# Patient Record
Sex: Male | Born: 1945 | Race: Black or African American | Hispanic: No | Smoking: Former smoker
Health system: Southern US, Community
[De-identification: ages and names within clinical notes are randomized; demographics above are authoritative.]

## PROBLEM LIST (undated history)

## (undated) DIAGNOSIS — I639 Cerebral infarction, unspecified: Secondary | ICD-10-CM

## (undated) DIAGNOSIS — Z951 Presence of aortocoronary bypass graft: Secondary | ICD-10-CM

## (undated) DIAGNOSIS — N182 Chronic kidney disease, stage 2 (mild): Secondary | ICD-10-CM

## (undated) DIAGNOSIS — Z95810 Presence of automatic (implantable) cardiac defibrillator: Secondary | ICD-10-CM

## (undated) DIAGNOSIS — I6529 Occlusion and stenosis of unspecified carotid artery: Secondary | ICD-10-CM

## (undated) DIAGNOSIS — I480 Paroxysmal atrial fibrillation: Secondary | ICD-10-CM

## (undated) DIAGNOSIS — F419 Anxiety disorder, unspecified: Secondary | ICD-10-CM

## (undated) DIAGNOSIS — I2510 Atherosclerotic heart disease of native coronary artery without angina pectoris: Secondary | ICD-10-CM

## (undated) DIAGNOSIS — I255 Ischemic cardiomyopathy: Secondary | ICD-10-CM

## (undated) DIAGNOSIS — E1129 Type 2 diabetes mellitus with other diabetic kidney complication: Secondary | ICD-10-CM

## (undated) DIAGNOSIS — I350 Nonrheumatic aortic (valve) stenosis: Secondary | ICD-10-CM

## (undated) DIAGNOSIS — I10 Essential (primary) hypertension: Secondary | ICD-10-CM

## (undated) HISTORY — PX: CARDIAC SURGERY: SHX584

## (undated) HISTORY — DX: Occlusion and stenosis of unspecified carotid artery: I65.29

---

## 2000-11-28 DIAGNOSIS — Z951 Presence of aortocoronary bypass graft: Secondary | ICD-10-CM

## 2000-11-28 HISTORY — DX: Presence of aortocoronary bypass graft: Z95.1

## 2012-05-28 DIAGNOSIS — Z95810 Presence of automatic (implantable) cardiac defibrillator: Secondary | ICD-10-CM

## 2012-05-28 HISTORY — DX: Presence of automatic (implantable) cardiac defibrillator: Z95.810

## 2012-05-28 HISTORY — PX: EP IMPLANTABLE DEVICE: SHX172B

## 2014-10-28 DIAGNOSIS — I639 Cerebral infarction, unspecified: Secondary | ICD-10-CM

## 2014-10-28 HISTORY — PX: CAROTID ENDARTERECTOMY: SUR193

## 2014-10-28 HISTORY — DX: Cerebral infarction, unspecified: I63.9

## 2014-12-04 ENCOUNTER — Encounter: Payer: Self-pay | Admitting: Occupational Therapy

## 2014-12-04 ENCOUNTER — Ambulatory Visit: Payer: Medicare Other

## 2014-12-04 ENCOUNTER — Ambulatory Visit: Payer: Medicare Other | Attending: Orthopedic Surgery

## 2014-12-04 ENCOUNTER — Ambulatory Visit: Payer: Medicare Other | Admitting: Occupational Therapy

## 2014-12-04 DIAGNOSIS — R269 Unspecified abnormalities of gait and mobility: Secondary | ICD-10-CM | POA: Diagnosis not present

## 2014-12-04 DIAGNOSIS — R4189 Other symptoms and signs involving cognitive functions and awareness: Secondary | ICD-10-CM | POA: Insufficient documentation

## 2014-12-04 DIAGNOSIS — H547 Unspecified visual loss: Secondary | ICD-10-CM | POA: Diagnosis not present

## 2014-12-04 DIAGNOSIS — G8191 Hemiplegia, unspecified affecting right dominant side: Secondary | ICD-10-CM

## 2014-12-04 DIAGNOSIS — I6980 Unspecified sequelae of other cerebrovascular disease: Secondary | ICD-10-CM | POA: Diagnosis not present

## 2014-12-04 DIAGNOSIS — R4701 Aphasia: Secondary | ICD-10-CM | POA: Diagnosis not present

## 2014-12-04 DIAGNOSIS — R279 Unspecified lack of coordination: Secondary | ICD-10-CM | POA: Insufficient documentation

## 2014-12-04 DIAGNOSIS — IMO0002 Reserved for concepts with insufficient information to code with codable children: Secondary | ICD-10-CM

## 2014-12-04 NOTE — Therapy (Addendum)
[facility] [address] [place], [place], [address] Phone: [phone]   Fax:  [phone]  Physical Therapy Evaluation  Patient Details  Name: Eric Mcguire MRN: [id number] Date of Birth: May 01, 1946 Referring Provider:  Dr. Madelaine Bhat  Encounter Date: 12/04/2014      PT End of Session - 12/04/14 1656    Visit Number 1   Number of Visits 17   Date for PT Re-Evaluation 02/02/15   Authorization Type G-code every 10th visit   PT Start Time 1448   PT Stop Time 1526   PT Time Calculation (min) 38 min   Equipment Utilized During Treatment Gait belt   Activity Tolerance Patient tolerated treatment well   Behavior During Therapy Impulsive  difficulty following commands.      History reviewed. No pertinent past medical history.  History reviewed. No pertinent past surgical history.  There were no vitals taken for this visit.  Visit Diagnosis:  Abnormality of gait  Lack of coordination due to stroke      Subjective Assessment - 12/04/14 1459    Symptoms Impaired balance, difficulty walking, R sided weakness, fatigue   Pertinent History CABG, CVA on 10/30/14   Patient Stated Goals more independent, "be alright, just be stronger", walk longer distances    Currently in Pain? No/denies          [facility] PT Assessment - 12/04/14 1502    Assessment   Medical Diagnosis CVA   Onset Date 10/30/14   Precautions   Precautions Fall   Restrictions   Weight Bearing Restrictions No   Balance Screen   Has the patient fallen in the past 6 months Yes   How many times? 2   Has the patient had a decrease in activity level because of a fear of falling?  Yes   Is the patient reluctant to leave their home because of a fear of falling?  Yes   Home Environment   Living Enviornment Private residence   Living Arrangements Other (Comment)  Phyllis (neice), nephew and grand nephew   Available Help at Discharge Family   Type of Home Other(Comment)  townhome   Home Access Level entry   Home Layout Two level   Alternate Level Stairs-Number of Steps 12   Alternate Level Stairs-Rails Right   Home Equipment [place] - single point;Shower seat;Walker - 2 wheels;Walker - 4 wheels   Prior Function   Level of Independence Independent with basic ADLs;Independent with homemaking with ambulation;Independent with transfers;Independent with gait   Vocation Retired   Leisure shopping, roadtrips   Cognition   Overall Cognitive Status Impaired/Different from baseline   Memory Impaired   Behaviors Impulsive   Sensation   Light Touch Impaired by gross assessment   Additional Comments Decreased R UE light touch   Coordination   Gross Motor Movements are Fluid and Coordinated No   Fine Motor Movements are Fluid and Coordinated No   Coordination and Movement Description Decreased RAM with R UE and decreased time for finger to thumb with R UE.   Posture/Postural Control   Posture/Postural Control No significant limitations   AROM   Overall AROM  Deficits   Overall AROM Comments L UE and B LE AROM WFL, R UE AROM limited and being addressed by OT.   Strength   Overall Strength Deficits   Overall Strength Comments B LE strength grossly 4+/5, except for B dorsiflexion 4/5.   Transfers   Transfers Sit to Stand;Stand to Sit  Sit to Stand 5: Supervision;With armrests;From chair/3-in-1   Stand to Sit 5: Supervision;With upper extremity assist;To chair/3-in-1   Ambulation/Gait   Ambulation/Gait Yes   Ambulation/Gait Assistance 5: Supervision;4: Min guard  min guard during turns   Ambulation/Gait Assistance Details Pt ambulated over even terrain, with no LOB but unsteadiness noted during turns. Pt also started off ambulating at slower pace and then was able to improve gait speed after first 20' of ambulation.   Ambulation Distance (Feet) 150 Feet   Assistive device None   Gait Pattern Step-through pattern;Decreased stride  length;Decreased dorsiflexion - right  decrease trunk rotation, and B UEs in IR during amb.   Gait velocity 3.5415ft/sec.   Balance   Balance Assessed Yes   Static Standing Balance   Static Standing - Balance Support No upper extremity supported   Static Standing - Level of Assistance 4: Min assist;Other (comment)  min guard   Static Standing - Comment/# of Minutes Pt able to perform feet together/apart with eyes open without LOB, but noted to experience increased postural sway during feet together with eyes close. Pt unable to maintain balance without min A during tandem or single leg stance.   Standardized Balance Assessment   Standardized Balance Assessment Timed Up and Go Test   Timed Up and Go Test   TUG Normal TUG   Normal TUG (seconds) 10.58  unsteady during turns and required min guard                            PT Short Term Goals - 12/04/14 1700    PT SHORT TERM GOAL #1   Title Pt will be independent in HEP to improve functional mobility. Target date: 01/01/15.   Status New   PT SHORT TERM GOAL #2   Title Assess BERG and write STGs and LTGs in necessary. Target date: 01/01/15.   Status New   PT SHORT TERM GOAL #3   Title Pt will ambulate 300', over even/uneven terrain and performing turns, without AD, with supervision to improve funcitonal mobility. Target date: 01/01/14.   Status New   PT SHORT TERM GOAL #4   Title Pt will ascend/descent 12 steps, with one handrail, at MOD I level to traverse stairs at home safely. Target date: 01/01/14.   Status New           PT Long Term Goals - 12/04/14 1702    PT LONG TERM GOAL #1   Title Pt will verbalize understanding of CVA signs/symptoms to decrease risk of future CVA. Target date: 01/29/15.   Status New   PT LONG TERM GOAL #2   Title Pt will ambulate 600' over even/uneven terrain, independently, to improve functional mobility. Target date: 01/29/15.   Status New   PT LONG TERM GOAL #3   Title Pt will perform 180  turns without LOB, independently to improve functional mobility. Target date: 01/29/15.   Status New   PT LONG TERM GOAL #4   Title Pt will report no falls in that last 4 weeks to improve safety during functional mobility. Target date: 01/29/15.   Status New               Plan - 12/04/14 1455    Clinical Impression Statement Pt is a pleasant 68y/o male presenting to OPPT neuro s/p CVA on 10/30/14. Pt's niece, Jamesetta Sohyllis, present during session, she provided some history as pt had difficulty answering some questions and with attention to  taks/following commands. Pt's family reported he has difficulty walking longer distances and impaired balance after the CVA. Pt was in inpatient rehab in 3 weeks where he received PT, OT, and speech. Pt has memory deficits and expressive aphasia.  Pt reported he sometimes "feels woozy". PT unable to test vision and gaze stabilization this visit, as pt was unable to attend to task at end of session.   Pt will benefit from skilled therapeutic intervention in order to improve on the following deficits Abnormal gait;Decreased endurance;Impaired sensation;Impaired UE functional use;Decreased cognition;Decreased mobility;Decreased strength;Decreased knowledge of use of DME;Decreased balance   Rehab Potential Good   PT Frequency 2x / week   PT Duration 8 weeks   PT Treatment/Interventions ADLs/Self Care Home Management;Gait training;Neuromuscular re-education;Stair training;Patient/family education;Functional mobility training;Biofeedback;Therapeutic activities;Therapeutic exercise;Manual techniques;Electrical Stimulation;DME Instruction;Balance training   PT Next Visit Plan BERG and DGI (if appropriate), assess vision (VOR, saccades, etc), initiate balance HEP   Consulted and Agree with Plan of Care Patient;Family member/caregiver   Family Member Consulted pt's niece, Julaine Hua - 12-28-2014 1706    Functional Assessment Tool Used TUG without AD: 10.58sec  with min guard required during turns due to unsteadiness; gait speed without AD:    Functional Limitation Mobility: Walking and moving around   Mobility: Walking and Moving Around Current Status [phone]) At least 20 percent but less than 40 percent impaired, limited or restricted   Mobility: Walking and Moving Around Goal Status [phone]) At least 1 percent but less than 20 percent impaired, limited or restricted       Problem List There are no active problems to display for this patient.   Miller,Jennifer L 12-28-2014, 5:12 PM  [facility] [address] [place], [place], [address] Phone: [phone]   Fax:  [phone]   Zerita Boers, PT,DPT December 28, 2014 5:13 PM Phone: [phone] Fax: [phone]   Please sign if you agree to PT plan of care:  MD signature: _______________________________________________

## 2014-12-04 NOTE — Therapy (Signed)
[facility] [address] [place], [place], [address] Phone: [phone]   Fax:  [phone]  Occupational Therapy Evaluation  Patient Details  Name: Eric Mcguire MRN: [id number] Date of Birth: 08/25/1946 Referring Provider:  No ref. provider found  Encounter Date: 12/04/2014      OT End of Session - 12/04/14 1438    Visit Number 1   Number of Visits 16   Date for OT Re-Evaluation 01/27/15   OT Start Time 1315   OT Stop Time 1401   OT Time Calculation (min) 46 min   Activity Tolerance Patient tolerated treatment well      History reviewed. No pertinent past medical history.  History reviewed. No pertinent past surgical history.  There were no vitals taken for this visit.  Visit Diagnosis:  Hemiplegia affecting right dominant side  Impaired cognition  Lack of coordination due to stroke  Visual impairment      Subjective Assessment - 12/04/14 1332    Symptoms I am here because I had a stroke   Pertinent History pt with h/o of L CVA, h/o of syncope episode, carotid stenosis, HTN, DM, CAD, cardiomyepathy, COPD, h/o depression, AICD placement, h/o of cardiac cath.   Currently in Pain? No/denies          [facility] OT Assessment - 12/04/14 1336    Assessment   Diagnosis L CVA   Onset Date 10/23/15   Prior Therapy inpatient rehab for 4 weeks   Precautions   Precautions None   Restrictions   Weight Bearing Restrictions No   Balance Screen   Has the patient fallen in the past 6 months No   Home  Environment   Family/patient expects to be discharged to: Private residence   Living Arrangements Other relatives   Available Help at Discharge --  niece, nephew and great grand nephew   Type of Home Other (Comment)  [place]   Home Layout Two level   Bathroom Accessibility Yes   How accessible --  pt states it is hard to do stairs   Additional Comments bathroom on second foor only   Prior Function   Level of Independence Independent with basic ADLs;Independent with homemaking with ambulation   Vocation Requirements retired   ADL   Eating/Feeding Modified independent   Grooming Modified independent   Upper Body Bathing Modified independent  niece stands outside door   Lower Body Bathing Modified independent   Upper Body Dressing Minimal assistance  buttons, zipping   Lower Body Dressing Minimal assistance   Toilet Tranfer Modified independent   Toileting - Clothing Manipulation Modified independent   Toileting -  Hygiene Modified Independent   Tub/Shower Transfer Modified independent  grab bar, shower seat   ADL comments pt lived with niece prior and did not engage in cooking or housekeeping.   IADL   Shopping Needs to be accompanied on any shopping trip   Light Housekeeping Needs help with all home maintenance tasks   Community Mobility Relies on family or friends for transportation   Medication Management Is not capable of dispensing or managing own medication   Financial Management Dependent   Mobility   Mobility Status Independent;Needs assist   Mobility Status Comments niece provides supervision in community due to cognition and balance   Written Expression   Dominant Hand Right   Handwriting --  Not able   Vision - History   Additional Comments pt with visual vestibular deficits and has difficulty watching tv and  reading   Vision Assessment   Eye Alignment Impaired (comment)   Tracking/Visual Pursuits Unable to hold eye position out of midline   Saccades Impaired - to be further tested in functional context   Visual Fields --  TBA   Activity Tolerance   Activity Tolerance Tolerates 10-20 min activity with muiltiple rests   Cognition   Attention Sustained   Sustained Attention Impaired   Memory Impaired   Awareness Appears intact   Problem Solving Impaired   Sensation   Light Touch Impaired by gross assessment   Hot/Cold Impaired by gross assessment    Proprioception Appears Intact   Coordination   Gross Motor Movements are Fluid and Coordinated No   Fine Motor Movements are Fluid and Coordinated No   9 Hole Peg Test (p) unable   Box and Blocks unable   Perception   Perception Not tested   Praxis   Praxis Not tested   Edema   Edema --  to be assessed further   AROM   Overall AROM  Deficits   Overall AROM Comments shoulder flexion approximately 140 degrees, abduction approximately 70 degrees, full elbow flexion and extension, 75% supination full pronation, full wrist flexion, 5 degrees wrist extension, full composite flexion, no active finger extension   PROM   Overall PROM  Within functional limits for tasks performed   Overall PROM Comments except wrist extension   Strength   Overall Strength Deficits   Overall Strength Comments to be further assessed   RUE Strength   Gross Grasp Impaired                         OT Short Term Goals - 12/04/14 1727    OT SHORT TERM GOAL #1   Title Pt and niece will be mod I with HEP - 01/01/2015   OT SHORT TERM GOAL #2   Title Pt will be mod I with fasteners with AE prn   Status New   OT SHORT TERM GOAL #3   Title Pt will be able to use RUE as stabilizer during basic ADL's   Status New   OT SHORT TERM GOAL #4   Title Pt will demonstrate functional mid level reach to assist in functional tasks   Status New   OT SHORT TERM GOAL #5   Title Pt will be able to hold objects in R hand to carry during ADL tasks   Status New           OT Long Term Goals - 12/04/14 1730    OT LONG TERM GOAL #1   Title Pt and niece will be mod I with HEP prn - 01/27/2015   Status New   OT LONG TERM GOAL #2   Title Pt will be mod I for sustained attention in quiet environment for familiar basic task for at least 15 minutes.   Status New   OT LONG TERM GOAL #3   Title Pt and niece will be able to state three strategies to use to compensate for memory   Status New   OT LONG TERM GOAL #4    Title Pt will be mod I for organization of simple functional familiar task   Status New   OT LONG TERM GOAL #5   Title Pt will use RUE as gross assist for ADL and IADL tasks at least 50% of the time.   Long Term Additional Goals   Additional Long Term Goals Yes  OT LONG TERM GOAL #6   Title Pt will be able to reach overhead to place plate on shelf bilaterally.   Status New               Plan - 12/17/14 1439    Clinical Impression Statement Pt is a 69 year old male s/p L CVA with resultant deficits as follows:  R hemiplegia (dominant side), decreased arom, decreased strength, decreased coordinaton, decreased functional use of RUE, decreaed cognition (impaired memory, attention, organization, problem solving), impaired balance, impaired visual skills (tracking, possible field cut vs R inattention, visual vestibular integration).  Deficits impact pt's ability to complete ADLs, IADLs, and leisure.  Pt was independent prior to his stroke.   Rehab Potential Good   Clinical Impairments Affecting Rehab Potential Pt can benefit from skilled OT to address the deficits and impairments listed above.   OT Frequency 2x / week   OT Duration 8 weeks   OT Treatment/Interventions Self-care/ADL training;Fluidtherapy;Therapeutic exercise;Neuromuscular education;Manual Therapy;Functional Mobility Training;DME and/or AE instruction;Visual/perceptual remediation/compensation;Cognitive remediation/compensation;Therapeutic activities;Therapeutic exercises;Splinting;Patient/family education;Balance training   Plan further assess tone, cog and visual skills   Consulted and Agree with Plan of Care Patient;Family member/caregiver   Family Member Consulted niece          G-Codes - 2014-12-17 1735    Functional Assessment Tool Used clincal observation - pt unable to participate in formal assessments   Functional Limitation Carrying, moving and handling objects   Carrying, Moving and Handling Objects Current  Status [phone]) 100 percent impaired, limited or restricted   Carrying, Moving and Handling Objects Goal Status (U0454) At least 60 percent but less than 80 percent impaired, limited or restricted      Problem List There are no active problems to display for this patient.   Norton Pastel 12/17/2014, 5:40 PM  [facility] [address] [place], [place], [address] Phone: [phone]   Fax:  [phone]    Physician: Dr. Buck Mam  Certification Start Date: 2014/12/17 Certification End Date: 23/11/2014  Physician Documentation Your signature is required to indicate approval of the treatment plan as stated above.  Please sign and either send electronically or make a copy of this report for your files and return this physician signed original.  Please mark one 1.__approve of plan   2. ___approve of plan with the followingconditions. ____________________________________________________________________________________________________________________________________________   ______________________                                                       _____________________ Physician Signature                                                                     Date    Faxed to MD for signature

## 2014-12-04 NOTE — Therapy (Signed)
[facility] [address] [place], [place], [address] Phone: [phone]   Fax:  [phone]  Speech Language Pathology Evaluation  Patient Details  Name: Eric Mcguire MRN: [id number] Date of Birth: 12-23-1945 Referring Provider:  No ref. provider found  Encounter Date: 12/04/2014      End of Session - 12/04/14 1506    Visit Number 1   Number of Visits 16   Date for SLP Re-Evaluation 01/31/15   SLP Start Time 1404   SLP Stop Time  1447   SLP Time Calculation (min) 43 min   Activity Tolerance Patient tolerated treatment well      No past medical history on file.  No past surgical history on file.  There were no vitals taken for this visit.  Visit Diagnosis: Expressive aphasia      Subjective Assessment - 12/04/14 1419    Symptoms "IF they want to cluck the plug, they cluck the plug" ("pluck the plug") "It gets sluggish and then not sluggish - depends on what roll I'm on."          SLP Evaluation [facility] - 12/04/14 1414    SLP Visit Information   Onset Date 10-30-14   Medical Diagnosis CVA   General Information   HPI Pt underwent carotid endarerectomy after syncopal episode, and suffered CVA.    Prior Functional Status   Cognitive/Linguistic Baseline Within functional limits    Lives With --  niece   Cognition   Memory --  pt had to ask niece for medical history   Auditory Comprehension   Overall Auditory Comprehension Comments Appears intact for therapy tasks today, however focused assessment will be completed during first therapy session.   Expression   Primary Mode of Expression Verbal   Verbal Expression   Overall Verbal Expression Impaired   Level of Generative/Spontaneous Verbalization Sentence;Conversation   Repetition No impairment   Naming Impairment   Responsive 76-100% accurate  85% with neologism x1/3   Confrontation 75-100% accurate  75%   Divergent 25-49% accurate   Verbal Errors  Semantic paraphasias;Not aware of errors;Inconsistent  word salad at times   Interfering Components Attention   Other Verbal Expression Comments Pt with moderate verbal expression deficit including semantic paraphasias, neologism, and occasional word salad. Simple conversation requiring short answers, at this time, is nearly functional, however niece reports pt's communication is occasionally difficult to understand. Pt shows variable error awareness, which decreases prognosis slightly.   Written Expression   Dominant Hand Right   Written Expression Exceptions to [facility]   Self Formulation Ability Letter;Word   Overall Writen Expression --  overall commensurate with spoken language               SLP Short Term Goals - 12/04/14 1508    SLP SHORT TERM GOAL #1   Title pt will perform simple naming tasks with 90% success  (01-04-15)   Time 4   Period Weeks   Status New   SLP SHORT TERM GOAL #2   Title pt will demo error awareness of naming errors in structured tasks 100% with rare min A (01-04-15)   Time 4   Period Weeks   Status New   SLP SHORT TERM GOAL #3   Title pt will generate sentence responses with appropriate language 80% with occasional min A (01-04-15)   Time 4   Period Weeks   Status New   SLP SHORT TERM GOAL #4   Title pt will participate in  simple conversation with rare min A for listener comprehension   Time 4   Period Weeks   Status New          SLP Long Term Goals - 12/04/14 1515    SLP LONG TERM GOAL #1   Title pt will participate in mod complex conversation with occasional min A (02-01-15)   Time 8   Period Weeks   Status New   SLP LONG TERM GOAL #2   Title pt will complete mod complex naming tasks with rare min A (02-01-15)   SLP LONG TERM GOAL #3   Title pt will utilize compensations for anomia with rare min a to do so   Time 8   Period Weeks   Status New          Plan - 12/04/14 1520    Clinical Impression Statement Pt presents with moderate  expressive aphasia with rare neologism, and word salad with intermittent error awareness. Expressive language deficit complicated slightly by suspected decr'd attention. Skilled ST needed to improve pt's ability to communicate and thus QOL.          G-Codes - 12/04/14 1521    Functional Assessment Tool Used noms   Functional Limitations Spoken language expressive   Spoken Language Expression Current Status [phone]([id number]) At least 60 percent but less than 80 percent impaired, limited or restricted   Spoken Language Expression Goal Status (U0454([id number]) At least 20 percent but less than 40 percent impaired, limited or restricted      Problem List There are no active problems to display for this patient.   HurstSCHINKE,CARL, SLP 12/04/2014, 3:26 PM  [facility] [facility] [address] [place], [place], [address] Phone: [phone]   Fax:  [phone]

## 2014-12-04 NOTE — Therapy (Signed)
[facility] [address] [place], [place], [address] Phone: [phone]   Fax:  [phone]  Occupational Therapy Evaluation  Patient Details  Name: Eric Mcguire MRN: [id number] Date of Birth: 06/06/1946 Referring Provider:  No ref. provider found  Encounter Date: 12/04/2014      OT End of Session - 12/04/14 1438    Visit Number 1   Number of Visits 16   Date for OT Re-Evaluation 01/27/15   OT Start Time 1315   OT Stop Time 1401   OT Time Calculation (min) 46 min   Activity Tolerance Patient tolerated treatment well      History reviewed. No pertinent past medical history.  History reviewed. No pertinent past surgical history.  There were no vitals taken for this visit.  Visit Diagnosis:  Hemiplegia affecting right dominant side  Impaired cognition  Lack of coordination due to stroke  Visual impairment      Subjective Assessment - 12/04/14 1332    Symptoms I am here because I had a stroke   Pertinent History pt with h/o of L CVA, h/o of syncope episode, carotid stenosis, HTN, DM, CAD, cardiomyepathy, COPD, h/o depression, AICD placement, h/o of cardiac cath.   Currently in Pain? No/denies          [facility] Assessment - 12/04/14 1336    Assessment   Diagnosis L CVA   Onset Date 10/23/15   Prior Therapy inpatient rehab for 4 weeks   Precautions   Precautions None   Restrictions   Weight Bearing Restrictions No   Balance Screen   Has the patient fallen in the past 6 months No   Home  Environment   Family/patient expects to be discharged to: Private residence   Living Arrangements Other relatives   Available Help at Discharge --  niece, nephew and great grand nephew   Type of Home Other (Comment)  [place]   Home Layout Two level   Bathroom Accessibility Yes   How accessible --  pt states it is hard to do stairs   Additional Comments bathroom on second foor only   Prior Function   Level of Independence Independent with basic ADLs;Independent with homemaking with ambulation   Vocation Requirements retired   ADL   Eating/Feeding Modified independent   Grooming Modified independent   Upper Body Bathing Modified independent  niece stands outside door   Lower Body Bathing Modified independent   Upper Body Dressing Minimal assistance  buttons, zipping   Lower Body Dressing Minimal assistance   Toilet Tranfer Modified independent   Toileting - Clothing Manipulation Modified independent   Toileting -  Hygiene Modified Independent   Tub/Shower Transfer Modified independent  grab bar, shower seat   ADL comments pt lived with niece prior and did not engage in cooking or housekeeping.   IADL   Shopping Needs to be accompanied on any shopping trip   Light Housekeeping Needs help with all home maintenance tasks   Community Mobility Relies on family or friends for transportation   Medication Management Is not capable of dispensing or managing own medication   Financial Management Dependent   Mobility   Mobility Status Independent;Needs assist   Mobility Status Comments niece provides supervision in community due to cognition and balance   Written Expression   Dominant Hand Right   Handwriting --  Not able   Vision - History   Additional Comments pt with visual vestibular deficits and has difficulty watching tv and  reading   Vision Assessment   Eye Alignment Impaired (comment)   Tracking/Visual Pursuits Unable to hold eye position out of midline   Saccades Impaired - to be further tested in functional context   Visual Fields --  TBA   Activity Tolerance   Activity Tolerance Tolerates 10-20 min activity with muiltiple rests   Cognition   Attention Sustained   Sustained Attention Impaired   Memory Impaired   Awareness Appears intact   Problem Solving Impaired   Sensation   Light Touch Impaired by gross assessment   Hot/Cold Impaired by gross assessment    Proprioception Appears Intact   Coordination   Gross Motor Movements are Fluid and Coordinated No   Fine Motor Movements are Fluid and Coordinated No   9 Hole Peg Test (p) unable   Box and Blocks unable   Perception   Perception Not tested   Praxis   Praxis Not tested   Edema   Edema --  to be assessed further   AROM   Overall AROM  Deficits   Overall AROM Comments shoulder flexion approximately 140 degrees, abduction approximately 70 degrees, full elbow flexion and extension, 75% supination full pronation, full wrist flexion, 5 degrees wrist extension, full composite flexion, no active finger extension   PROM   Overall PROM  Within functional limits for tasks performed   Overall PROM Comments except wrist extension   Strength   Overall Strength Deficits   Overall Strength Comments to be further assessed   RUE Strength   Gross Grasp Impaired                         OT Short Term Goals - 12/04/14 1727    OT SHORT TERM GOAL #1   Title Pt and niece will be mod I with HEP - 01/01/2015   OT SHORT TERM GOAL #2   Title Pt will be mod I with fasteners with AE prn   Status New   OT SHORT TERM GOAL #3   Title Pt will be able to use RUE as stabilizer during basic ADL's   Status New   OT SHORT TERM GOAL #4   Title Pt will demonstrate functional mid level reach to assist in functional tasks   Status New   OT SHORT TERM GOAL #5   Title Pt will be able to hold objects in R hand to carry during ADL tasks   Status New           OT Long Term Goals - 12/04/14 1730    OT LONG TERM GOAL #1   Title Pt and niece will be mod I with HEP prn - 01/27/2015   Status New   OT LONG TERM GOAL #2   Title Pt will be mod I for sustained attention in quiet environment for familiar basic task for at least 15 minutes.   Status New   OT LONG TERM GOAL #3   Title Pt and niece will be able to state three strategies to use to compensate for memory   Status New   OT LONG TERM GOAL #4    Title Pt will be mod I for organization of simple functional familiar task   Status New   OT LONG TERM GOAL #5   Title Pt will use RUE as gross assist for ADL and IADL tasks at least 50% of the time.   Long Term Additional Goals   Additional Long Term Goals Yes  OT LONG TERM GOAL #6   Title Pt will be able to reach overhead to place plate on shelf bilaterally.   Status New               Plan - 12-24-2014 1439    Clinical Impression Statement Pt is a 69 year old male s/p L CVA with resultant deficits as follows:  R hemiplegia (dominant side), decreased arom, decreased strength, decreased coordinaton, decreased functional use of RUE, decreaed cognition (impaired memory, attention, organization, problem solving), impaired balance, impaired visual skills (tracking, possible field cut vs R inattention, visual vestibular integration).  Deficits impact pt's ability to complete ADLs, IADLs, and leisure.  Pt was independent prior to his stroke.   Rehab Potential Good   Clinical Impairments Affecting Rehab Potential Pt can benefit from skilled OT to address the deficits and impairments listed above.   OT Frequency 2x / week   OT Duration 8 weeks   OT Treatment/Interventions Self-care/ADL training;Fluidtherapy;Therapeutic exercise;Neuromuscular education;Manual Therapy;Functional Mobility Training;DME and/or AE instruction;Visual/perceptual remediation/compensation;Cognitive remediation/compensation;Therapeutic activities;Therapeutic exercises;Splinting;Patient/family education;Balance training   Plan further assess tone, cog and visual skills   Consulted and Agree with Plan of Care Patient;Family member/caregiver   Family Member Consulted niece          G-Codes - December 24, 2014 1735    Functional Assessment Tool Used clincal observation - pt unable to participate in formal assessments   Functional Limitation Carrying, moving and handling objects   Carrying, Moving and Handling Objects Current  Status [phone]) 100 percent impaired, limited or restricted   Carrying, Moving and Handling Objects Goal Status (B2841) At least 60 percent but less than 80 percent impaired, limited or restricted      Problem List There are no active problems to display for this patient.   Norton Pastel, OTR/L 2014-12-24, 5:36 PM  [facility] [address] [place], [place], [address] Phone: [phone]   Fax:  [phone]

## 2014-12-08 ENCOUNTER — Telehealth: Payer: Self-pay

## 2014-12-08 ENCOUNTER — Ambulatory Visit: Payer: Medicare Other

## 2014-12-08 NOTE — Telephone Encounter (Signed)
Pt no-showed due to admission to [facility] [place] for [facility] issues. Told Phillis (niece, caregiver) we needed resume orders.

## 2014-12-10 ENCOUNTER — Ambulatory Visit: Payer: Medicare Other

## 2014-12-16 ENCOUNTER — Ambulatory Visit: Payer: Medicare Other | Admitting: Physical Therapy

## 2014-12-16 ENCOUNTER — Ambulatory Visit: Payer: Medicare Other | Admitting: Occupational Therapy

## 2014-12-16 ENCOUNTER — Ambulatory Visit: Payer: Medicare Other

## 2014-12-16 ENCOUNTER — Telehealth: Payer: Self-pay | Admitting: Occupational Therapy

## 2014-12-16 NOTE — Telephone Encounter (Signed)
Pt did not show for appointment. Attempted to call but no answer.  Left message that he had an appointment as well as 2 other appointments today.  Asked pt or family to please call the outpatient center to clarify appointments.

## 2014-12-22 ENCOUNTER — Telehealth: Payer: Self-pay | Admitting: *Deleted

## 2014-12-23 ENCOUNTER — Encounter: Payer: Medicare Other | Admitting: Occupational Therapy

## 2014-12-23 ENCOUNTER — Ambulatory Visit: Payer: Medicare Other | Admitting: Physical Therapy

## 2014-12-23 ENCOUNTER — Ambulatory Visit: Payer: Medicare Other

## 2014-12-23 ENCOUNTER — Telehealth: Payer: Self-pay

## 2014-12-23 NOTE — Therapy (Signed)
[facility] [address] [place], [place], [address] Phone: [phone]   Fax:  [phone]  Patient Details  Name: Eric Mcguire MRN: [id number] Date of Birth: January 10, 1946 Referring Provider:  No ref. provider found  Encounter Date: 12/23/2014  PHYSICAL THERAPY DISCHARGE SUMMARY  Visits from Start of Care: 1  Current functional level related to goals / functional outcomes: PT SHORT TERM GOAL #1     Title  Pt will be independent in HEP to improve functional mobility. Target date: 01/01/15.    Status  New    PT SHORT TERM GOAL #2    Title  Assess BERG and write STGs and LTGs in necessary. Target date: 01/01/15.    Status  New    PT SHORT TERM GOAL #3    Title  Pt will ambulate 300', over even/uneven terrain and performing turns, without AD, with supervision to improve funcitonal mobility. Target date: 01/01/14.    Status  New    PT SHORT TERM GOAL #4    Title  Pt will ascend/descent 12 steps, with one handrail, at MOD I level to traverse stairs at home safely. Target date: 01/01/14.    Status  New        12/04/14 1702    PT LONG TERM GOAL #1    Title  Pt will verbalize understanding of CVA signs/ symptoms to decrease risk of future CVA. Target date: 01/29/15.    Status  New    PT LONG TERM GOAL #2    Title  Pt will ambulate 600' over even/uneven terrain, independently, to improve functional mobility. Target date: 01/29/15.    Status  New    PT LONG TERM GOAL #3    Title  Pt will perform 180 turns without LOB, independently to improve functional mobility. Target date: 01/29/15.    Status  New    PT LONG TERM GOAL #4    Title  Pt will report no falls in that last 4 weeks to improve safety during functional mobility. Target date: 01/29/15.    Status  New       Remaining deficits: Unknown, as pt was only seen for a PT evaluation and was then hospitalized at the [place]. It  is unknown if pt met any goals, as he was not seen after initial evaluation. Pt is now being followed by home health PT after hospital discharge, so [facility] neuro is discharging pt.   Education / Equipment: none Plan: Patient agrees to discharge.  Patient goals were not met. Patient is being discharged due to a change in medical status.  ?????       Ansar Skoda L 12/23/2014, 12:10 PM  [facility] [address] [place], [place], [address] Phone: [phone]   Fax:  [phone]   Geoffry Paradise, PT,DPT 12/23/2014 12:10 PM Phone: [phone] Fax: [phone]

## 2014-12-23 NOTE — Therapy (Signed)
[facility] [address] [place], [place], [address] Phone: [phone]   Fax:  [phone]  Patient Details  Name: Eric Mcguire MRN: [id number] Date of Birth: January 18, 1946 Referring Provider:  No ref. provider found  Encounter Date: 12/23/2014 SPEECH THERAPY DISCHARGE SUMMARY  Visits from Start of Care: **  Current functional level related to goals / functional outcomes: Pt was admitted to [facility] after initial eval earlier this month and discharged from that facility with home health therapies, according to niece/caregiver Phillis. Will d/c pt at this time.   Remaining deficits: All deficits remain.    Education / Equipment: None provided - plan was to provide during therapy sessions.  Plan: Patient agrees to discharge.  Patient goals were not met. Patient is being discharged due to a change in medical status.  ?????       [place], SLP 12/23/2014, 11:45 AM  [facility] [address] [place], [place], [address] Phone: [phone]   Fax:  [phone]

## 2014-12-23 NOTE — Telephone Encounter (Signed)
Phillis (niece and caregiver) said pt was [facility]. Therapists at this clinic will d/c pt at this time.

## 2014-12-24 ENCOUNTER — Encounter: Payer: Self-pay | Admitting: Occupational Therapy

## 2014-12-24 NOTE — Therapy (Signed)
[facility] [address] [place], [place], [address] Phone: [phone]   Fax:  [phone]  Patient Details  Name: Eric Mcguire MRN: [id number] Date of Birth: 18-Feb-1946 Referring Provider:  No ref. provider found  Encounter Date: 12/24/2014  OCCUPATIONAL THERAPY DISCHARGE SUMMARY  Visits from Start of Care:1  Current functional level related to goals / functional outcomes: Please see original eval - pt participated in OT eval and then had medical issues and was hospitalized.  Pt was discharged home with order for home health therapies therefore will d/ from outpatient OT services at this time.   Remaining deficits: See initial eval   Education / Equipment: N/A  Plan: Patient agrees to discharge.  Patient goals were not met. Patient is being discharged due to a change in medical status.  ?????     Forde Radon [facility] 12/24/2014, 8:33 AM  [facility] [address] [place], [place], [address] Phone: [phone]   Fax:  [phone]

## 2014-12-25 ENCOUNTER — Ambulatory Visit: Payer: Medicare Other | Admitting: Occupational Therapy

## 2014-12-25 ENCOUNTER — Ambulatory Visit: Payer: Medicare Other | Admitting: Speech Pathology

## 2014-12-25 ENCOUNTER — Ambulatory Visit: Payer: Medicare Other

## 2014-12-29 ENCOUNTER — Ambulatory Visit: Payer: Medicare Other

## 2014-12-29 ENCOUNTER — Encounter: Payer: Medicare Other | Admitting: Occupational Therapy

## 2015-01-01 ENCOUNTER — Ambulatory Visit: Payer: Medicare Other

## 2015-01-01 ENCOUNTER — Encounter: Payer: Medicare Other | Admitting: Occupational Therapy

## 2015-01-06 ENCOUNTER — Encounter: Payer: Medicare Other | Admitting: Occupational Therapy

## 2015-01-06 ENCOUNTER — Ambulatory Visit: Payer: Medicare Other

## 2015-01-08 ENCOUNTER — Ambulatory Visit: Payer: Medicare Other

## 2015-01-08 ENCOUNTER — Encounter: Payer: Medicare Other | Admitting: Occupational Therapy

## 2015-01-28 ENCOUNTER — Emergency Department (HOSPITAL_COMMUNITY): Payer: Medicare Other

## 2015-01-28 ENCOUNTER — Encounter (HOSPITAL_COMMUNITY): Payer: Self-pay

## 2015-01-28 ENCOUNTER — Emergency Department (HOSPITAL_COMMUNITY)
Admission: EM | Admit: 2015-01-28 | Discharge: 2015-01-28 | Disposition: A | Discharge status: Home / Self Care | Payer: Medicare Other | Attending: Emergency Medicine | Admitting: Emergency Medicine

## 2015-01-28 DIAGNOSIS — Z79899 Other long term (current) drug therapy: Secondary | ICD-10-CM | POA: Insufficient documentation

## 2015-01-28 DIAGNOSIS — Z951 Presence of aortocoronary bypass graft: Secondary | ICD-10-CM | POA: Diagnosis not present

## 2015-01-28 DIAGNOSIS — I10 Essential (primary) hypertension: Secondary | ICD-10-CM | POA: Diagnosis not present

## 2015-01-28 DIAGNOSIS — Z950 Presence of cardiac pacemaker: Secondary | ICD-10-CM | POA: Diagnosis not present

## 2015-01-28 DIAGNOSIS — I2510 Atherosclerotic heart disease of native coronary artery without angina pectoris: Secondary | ICD-10-CM | POA: Diagnosis not present

## 2015-01-28 DIAGNOSIS — Z87891 Personal history of nicotine dependence: Secondary | ICD-10-CM | POA: Diagnosis not present

## 2015-01-28 DIAGNOSIS — R2231 Localized swelling, mass and lump, right upper limb: Secondary | ICD-10-CM | POA: Insufficient documentation

## 2015-01-28 DIAGNOSIS — Z794 Long term (current) use of insulin: Secondary | ICD-10-CM | POA: Diagnosis not present

## 2015-01-28 DIAGNOSIS — M7989 Other specified soft tissue disorders: Secondary | ICD-10-CM

## 2015-01-28 DIAGNOSIS — E119 Type 2 diabetes mellitus without complications: Secondary | ICD-10-CM | POA: Insufficient documentation

## 2015-01-28 DIAGNOSIS — Z9889 Other specified postprocedural states: Secondary | ICD-10-CM | POA: Diagnosis not present

## 2015-01-28 DIAGNOSIS — Z7982 Long term (current) use of aspirin: Secondary | ICD-10-CM | POA: Insufficient documentation

## 2015-01-28 DIAGNOSIS — M79641 Pain in right hand: Secondary | ICD-10-CM | POA: Diagnosis present

## 2015-01-28 DIAGNOSIS — Z8673 Personal history of transient ischemic attack (TIA), and cerebral infarction without residual deficits: Secondary | ICD-10-CM | POA: Diagnosis not present

## 2015-01-28 HISTORY — DX: Presence of aortocoronary bypass graft: Z95.1

## 2015-01-28 HISTORY — DX: Atherosclerotic heart disease of native coronary artery without angina pectoris: I25.10

## 2015-01-28 HISTORY — DX: Cerebral infarction, unspecified: I63.9

## 2015-01-28 HISTORY — DX: Essential (primary) hypertension: I10

## 2015-01-28 MED ORDER — ACETAMINOPHEN ER 650 MG PO TBCR: 650.0000 mg | EXTENDED_RELEASE_TABLET | Freq: Three times a day (TID) | ORAL | Status: AC | PRN

## 2015-01-28 MED ORDER — CEPHALEXIN 500 MG PO CAPS: 500.0000 mg | ORAL_CAPSULE | Freq: Four times a day (QID) | ORAL | Status: DC

## 2015-01-28 NOTE — ED Notes (Addendum)
Pt states that he's been working his hands out with weights in therapy because of his stroke and he might have hit it somehow.

## 2015-01-28 NOTE — ED Provider Notes (Signed)
CSN: [id number]     Arrival date & time 01/28/15  2016 History  This chart was scribed for non-physician practitioner, Emilia BeckKaitlyn Kanita Delage, PA-C working with Richardean Canalavid H Yao, MD by Gwenyth Oberatherine Macek, ED scribe. This patient was seen in room WTR6/WTR6 and the patient's care was started at 9:33 PM   Chief Complaint  Patient presents with  . Hand Pain   The history is provided by the patient. No language interpreter was used.    HPI Comments: Eric Mcguire is a 69 y.o. male brought in by his daughter who presents to the Emergency Department complaining of constant, moderate right hand swelling that started yesterday. He denies recent injuries, but reports exercising with 1-2 lb weights in the affected hand. Pt has history of CVA that left him with right-sided weakness. He denies associated pain.  Pt's daughter notes increased memory loss and confusion in the last few weeks. She states a red and glassy appearance to his eyes that started a few days ago as an associated symptom. Pt also has baseline back pain that is unchanged today.  [facility]  Past Medical History  Diagnosis Date  . Stroke   . Hypertension   . Diabetes mellitus without complication   . Coronary artery disease   . Pacemaker   . S/P CABG x 3   . Atrial fibrillation    Past Surgical History  Procedure Laterality Date  . Cardiac surgery     History reviewed. No pertinent family history. History  Substance Use Topics  . Smoking status: Former Smoker    Quit date: 12/04/1994  . Smokeless tobacco: Not on file  . Alcohol Use: No    Review of Systems  Musculoskeletal: Positive for joint swelling and arthralgias.  Psychiatric/Behavioral: Positive for confusion.  All other systems reviewed and are negative.     Allergies  Review of patient's allergies indicates not on file.  Home Medications   Prior to Admission medications   Medication Sig Start Date End Date Taking? Authorizing Provider  amiodarone (PACERONE)  200 MG tablet Take 200 mg by mouth daily.    Historical Provider, MD  amLODipine (NORVASC) 10 MG tablet Take by mouth daily.    Historical Provider, MD  aspirin 81 MG tablet Take 81 mg by mouth daily.    Historical Provider, MD  atorvastatin (LIPITOR) 40 MG tablet Take 40 mg by mouth daily.    Historical Provider, MD  busPIRone (BUSPAR) 10 MG tablet Take 10 mg by mouth 3 (three) times daily.    Historical Provider, MD  carvedilol (COREG) 6.25 MG tablet Take 6.25 mg by mouth 2 (two) times daily with a meal.    Historical Provider, MD  docusate sodium (COLACE) 100 MG capsule Take 100 mg by mouth 2 (two) times daily.    Historical Provider, MD  hydrALAZINE (APRESOLINE) 10 MG tablet Take 10 mg by mouth 3 (three) times daily.    Historical Provider, MD  insulin aspart (NOVOLOG) 100 UNIT/ML injection Inject 8 Units into the skin 3 (three) times daily before meals.    Historical Provider, MD  insulin detemir (LEVEMIR) 100 UNIT/ML injection Inject 12 Units into the skin at bedtime.    Historical Provider, MD  insulin lispro (HUMALOG) 100 UNIT/ML injection Inject into the skin 3 (three) times daily before meals.    Historical Provider, MD  lisinopril (PRINIVIL,ZESTRIL) 40 MG tablet Take 40 mg by mouth daily.    Historical Provider, MD  pantoprazole (PROTONIX) 40 MG tablet Take 40  mg by mouth daily.    Historical Provider, MD   BP 172/66 mmHg  Pulse 74  Temp(Src) 97.5 F (36.4 C) (Oral)  Resp 20  SpO2 97% Physical Exam  Constitutional: He appears well-developed and well-nourished. No distress.  HENT:  Head: Normocephalic and atraumatic.  Eyes: Conjunctivae and EOM are normal.  Neck: Neck supple. No tracheal deviation present.  Cardiovascular: Normal rate and intact distal pulses.   Radial pulse intact of right wrist  Pulmonary/Chest: Effort normal. No respiratory distress.  Abdominal: Soft. He exhibits no distension. There is no tenderness. There is no rebound.  Musculoskeletal: He exhibits  edema. He exhibits no tenderness.  Generalized edema to right hand; no obvious deformity; full ROM of right wrist and fingers of right hand  Neurological: He is alert. Coordination normal.  Sensation intact of right hand.  Skin: Skin is warm and dry.  Psychiatric: He has a normal mood and affect. His behavior is normal.  Nursing note and vitals reviewed.   ED Course  Procedures (including critical care time) DIAGNOSTIC STUDIES: Oxygen Saturation is 97% on RA, normal by my interpretation.    COORDINATION OF CARE: 9:43 PM Discussed treatment plan with pt and his daughter at bedside. They agreed to plan.  Labs Review Labs Reviewed - No data to display  Imaging Review Dg Hand Complete Right  01/28/2015   CLINICAL DATA:  RIGHT hand swelling, no known injury, decreased feeling in hand, history stroke, hypertension, diabetes, coronary artery disease post CABG  EXAM: RIGHT HAND - COMPLETE 3+ VIEW  COMPARISON:  None  FINDINGS: Diffuse soft tissue swelling.  Osseous mineralization normal.  Joint spaces preserved.  No fracture, dislocation or bone destruction.  IMPRESSION: Soft tissue swelling without acute osseous abnormalities.   Electronically Signed   By: Ulyses Southward M.D.   On: 01/28/2015 21:53     EKG Interpretation None      MDM   Final diagnoses:  Swelling of right hand    10:23 PM Patient's xray shows soft tissue swelling without other acute changes. Patient has no signs of infection such as warmth, redness, or tenderness. Patient reports injury but patient's daughter denies this. Patient will have tylenol for any pain and keflex for infection prophylaxis. Vitals stable and patient afebrile. No neurovascular compromise. Patient will return with worsening or concerning symptoms.   I personally performed the services described in this documentation, which was scribed in my presence. The recorded information has been reviewed and is accurate.     Emilia Beck, PA-C 01/28/15  2347  Richardean Canal, MD 01/29/15 [phone]

## 2015-01-28 NOTE — ED Notes (Signed)
Pt complains of his right hand being swollen, unknown injury, weaker on the right side because of a CVA, the hand is not sore when touched

## 2015-01-28 NOTE — Discharge Instructions (Signed)
Take tylenol as needed for pain. Take keflex as directed until gone. Refer to attached documents for more information. You will be contacted about your ultrasound study to rule out blood clot. Return to the ED with worsening or concerning symptoms.

## 2015-01-29 ENCOUNTER — Ambulatory Visit (HOSPITAL_COMMUNITY): Payer: Medicare Other | Attending: Emergency Medicine

## 2015-05-17 ENCOUNTER — Emergency Department (HOSPITAL_COMMUNITY): Payer: Non-veteran care

## 2015-05-17 ENCOUNTER — Emergency Department (HOSPITAL_COMMUNITY)
Admission: EM | Admit: 2015-05-17 | Discharge: 2015-05-17 | Disposition: A | Discharge status: Home / Self Care | Payer: Non-veteran care | Attending: Emergency Medicine | Admitting: Emergency Medicine

## 2015-05-17 ENCOUNTER — Encounter (HOSPITAL_COMMUNITY): Payer: Self-pay | Admitting: Emergency Medicine

## 2015-05-17 DIAGNOSIS — Z951 Presence of aortocoronary bypass graft: Secondary | ICD-10-CM | POA: Insufficient documentation

## 2015-05-17 DIAGNOSIS — Z87891 Personal history of nicotine dependence: Secondary | ICD-10-CM | POA: Diagnosis not present

## 2015-05-17 DIAGNOSIS — Z7982 Long term (current) use of aspirin: Secondary | ICD-10-CM | POA: Diagnosis not present

## 2015-05-17 DIAGNOSIS — R739 Hyperglycemia, unspecified: Secondary | ICD-10-CM

## 2015-05-17 DIAGNOSIS — Z794 Long term (current) use of insulin: Secondary | ICD-10-CM | POA: Insufficient documentation

## 2015-05-17 DIAGNOSIS — Z8673 Personal history of transient ischemic attack (TIA), and cerebral infarction without residual deficits: Secondary | ICD-10-CM | POA: Diagnosis not present

## 2015-05-17 DIAGNOSIS — R5383 Other fatigue: Secondary | ICD-10-CM | POA: Diagnosis not present

## 2015-05-17 DIAGNOSIS — Z79899 Other long term (current) drug therapy: Secondary | ICD-10-CM | POA: Diagnosis not present

## 2015-05-17 DIAGNOSIS — I4891 Unspecified atrial fibrillation: Secondary | ICD-10-CM | POA: Diagnosis not present

## 2015-05-17 DIAGNOSIS — Z950 Presence of cardiac pacemaker: Secondary | ICD-10-CM | POA: Insufficient documentation

## 2015-05-17 DIAGNOSIS — E1165 Type 2 diabetes mellitus with hyperglycemia: Secondary | ICD-10-CM | POA: Insufficient documentation

## 2015-05-17 DIAGNOSIS — I2510 Atherosclerotic heart disease of native coronary artery without angina pectoris: Secondary | ICD-10-CM | POA: Diagnosis not present

## 2015-05-17 DIAGNOSIS — Z9889 Other specified postprocedural states: Secondary | ICD-10-CM | POA: Diagnosis not present

## 2015-05-17 DIAGNOSIS — I10 Essential (primary) hypertension: Secondary | ICD-10-CM | POA: Insufficient documentation

## 2015-05-17 LAB — BASIC METABOLIC PANEL
ANION GAP: 7 (ref 5–15)
BUN: 28 mg/dL — ABNORMAL HIGH (ref 6–20)
CALCIUM: 8.8 mg/dL — AB (ref 8.9–10.3)
CO2: 23 mmol/L (ref 22–32)
Chloride: 105 mmol/L (ref 101–111)
Creatinine, Ser: 1.04 mg/dL (ref 0.61–1.24)
GFR calc Af Amer: >60 mL/min (ref >60)
GLUCOSE: 276 mg/dL — AB (ref 65–99)
Potassium: 4.4 mmol/L (ref 3.5–5.1)
Sodium: 135 mmol/L (ref 135–145)

## 2015-05-17 LAB — CBC WITH DIFFERENTIAL/PLATELET
BASOS ABS: 0 10*3/uL (ref 0.0–0.1)
Basophils Relative: 0 % (ref 0–1)
Eosinophils Absolute: 0.1 10*3/uL (ref 0.0–0.7)
Eosinophils Relative: 2 % (ref 0–5)
HCT: 36.4 % — ABNORMAL LOW (ref 39.0–52.0)
Hemoglobin: 11.7 g/dL — ABNORMAL LOW (ref 13.0–17.0)
LYMPHS PCT: 22 % (ref 12–46)
Lymphs Abs: 1.4 10*3/uL (ref 0.7–4.0)
MCH: 31 pg (ref 26.0–34.0)
MCHC: 32.1 g/dL (ref 30.0–36.0)
MCV: 96.3 fL (ref 78.0–100.0)
MONOS PCT: 16 % — AB (ref 3–12)
Monocytes Absolute: 1 10*3/uL (ref 0.1–1.0)
NEUTROS PCT: 60 % (ref 43–77)
Neutro Abs: 3.8 10*3/uL (ref 1.7–7.7)
Platelets: 193 10*3/uL (ref 150–400)
RBC: 3.78 MIL/uL — AB (ref 4.22–5.81)
RDW: 13 % (ref 11.5–15.5)
WBC: 6.4 10*3/uL (ref 4.0–10.5)

## 2015-05-17 LAB — I-STAT TROPONIN, ED: Troponin i, poc: 0.06 ng/mL (ref 0.00–0.08)

## 2015-05-17 LAB — URINALYSIS, ROUTINE W REFLEX MICROSCOPIC
Bilirubin Urine: NEGATIVE
Glucose, UA: 500 mg/dL — AB
Hgb urine dipstick: NEGATIVE
KETONES UR: NEGATIVE mg/dL
LEUKOCYTES UA: NEGATIVE
NITRITE: NEGATIVE
Protein, ur: NEGATIVE mg/dL
Specific Gravity, Urine: 1.025 (ref 1.005–1.030)
UROBILINOGEN UA: 1 mg/dL (ref 0.0–1.0)
pH: 5 (ref 5.0–8.0)

## 2015-05-17 LAB — CBG MONITORING, ED: GLUCOSE-CAPILLARY: 238 mg/dL — AB (ref 65–99)

## 2015-05-17 MED ORDER — FERROUS SULFATE 325 (65 FE) MG PO TABS: 325.0000 mg | ORAL_TABLET | Freq: Three times a day (TID) | ORAL | Status: AC

## 2015-05-17 NOTE — ED Provider Notes (Signed)
CSN: [id number]     Arrival date & time 05/17/15  1125 History   First MD Initiated Contact with Patient 05/17/15 1232     Chief Complaint  Patient presents with  . Hyperglycemia  . Fatigue     (Consider location/radiation/quality/duration/timing/severity/associated sxs/prior Treatment) Patient is a 69 y.o. male presenting with hyperglycemia. The history is provided by the patient and medical records.  Hyperglycemia Associated symptoms: fatigue     This is a 69 year old male with past medical history significant for hypertension, diabetes, coronary artery disease, A. fib not on anticoagulation, presenting to the ED for fatigue per niece-- however patient has no current physical complaints. Patient's niece is at bedside who provides most of history as she helps take care of him on a daily basis. He was hospitalized at the [place] 2 months ago for fatigue. He was found to have low hemoglobin around 6 and was transfused blood. They suspected he had a GI bleed, however his upper endoscopy and colonoscopy were negative for bleed. She states they adjusted his medications and added iron supplements. She states over the past week he has had decreased energy from his baseline. She states usually he will leave the house at least once or twice a day, however has not left the house in 1 week. Patient states "I just don't feel like going anywhere". There is been no real changes in behavior or cognition. Niece reports he does have some confusion regarding date and time, however this is been progressive and not sudden onset. She also has concern about his blood sugar as usually is around 90-120 range and over the past 2 days has been 250+.  Patient does admit to drinking water more than normal, but states this is because he was told to do so. He denies any nausea, vomiting, abdominal pain. No chest pain or shortness of breath. He denies any dysuria. Niece does report he has not had his iron supplements in the past 5 days  as it did not come in the mail as it should. She was not sure what to replace this with so she did not give him anything. He has continued taking all other medications as directed.  No recent chest pain, SOB, palpitations, dizziness, or episodes of syncope.  No noted bloody stools.  VSS.  Past Medical History  Diagnosis Date  . Stroke   . Hypertension   . Diabetes mellitus without complication   . Coronary artery disease   . Pacemaker   . S/P CABG x 3   . Atrial fibrillation    Past Surgical History  Procedure Laterality Date  . Cardiac surgery     No family history on file. History  Substance Use Topics  . Smoking status: Former Smoker    Quit date: 12/04/1994  . Smokeless tobacco: Not on file  . Alcohol Use: No    Review of Systems  Constitutional: Positive for fatigue.  All other systems reviewed and are negative.     Allergies  Review of patient's allergies indicates no known allergies.  Home Medications   Prior to Admission medications   Medication Sig Start Date End Date Taking? Authorizing Provider  amLODipine (NORVASC) 10 MG tablet Take 10 mg by mouth daily.    Yes Historical Provider, MD  aspirin 81 MG tablet Take 81 mg by mouth daily.   Yes Historical Provider, MD  atorvastatin (LIPITOR) 80 MG tablet Take 80 mg by mouth daily.   Yes Historical Provider, MD  carvedilol (COREG) 3.125  MG tablet Take 3.125 mg by mouth 2 (two) times daily with a meal.   Yes Historical Provider, MD  cholecalciferol (VITAMIN D) 1000 UNITS tablet Take 1,000 Units by mouth daily.   Yes Historical Provider, MD  clopidogrel (PLAVIX) 75 MG tablet Take 75 mg by mouth daily.   Yes Historical Provider, MD  ferrous sulfate 325 (65 FE) MG tablet Take 325 mg by mouth 3 (three) times daily with meals.   Yes Historical Provider, MD  ibuprofen (ADVIL,MOTRIN) 200 MG tablet Take 400-600 mg by mouth every 6 (six) hours as needed for headache or moderate pain.   Yes Historical Provider, MD  insulin  NPH-regular Human (NOVOLIN 70/30) (70-30) 100 UNIT/ML injection Inject 50-55 Units into the skin 2 (two) times daily. 55 units in the am and 50 units at night.   Yes Historical Provider, MD  lisinopril (PRINIVIL,ZESTRIL) 40 MG tablet Take 40 mg by mouth daily.   Yes Historical Provider, MD  magnesium oxide (MAG-OX) 400 MG tablet Take 400 mg by mouth daily.   Yes Historical Provider, MD  pantoprazole (PROTONIX) 40 MG tablet Take 40 mg by mouth daily.   Yes Historical Provider, MD  acetaminophen (TYLENOL 8 HOUR) 650 MG CR tablet Take 1 tablet (650 mg total) by mouth every 8 (eight) hours as needed for pain. Patient not taking: Reported on 05/17/2015 01/28/15   Emilia Beck, PA-C  cephALEXin (KEFLEX) 500 MG capsule Take 1 capsule (500 mg total) by mouth 4 (four) times daily. Patient not taking: Reported on 05/17/2015 01/28/15   Kaitlyn Szekalski, PA-C   BP 131/48 mmHg  Pulse 90  Temp(Src) 98.2 F (36.8 C) (Oral)  Resp 16  SpO2 95%   Physical Exam  Constitutional: He is oriented to person, place, and time. He appears well-developed and well-nourished. No distress.  Elderly, appears well  HENT:  Head: Normocephalic and atraumatic.  Mouth/Throat: Oropharynx is clear and moist.  Eyes: Conjunctivae and EOM are normal. Pupils are equal, round, and reactive to light.  Neck: Normal range of motion. Neck supple.  Cardiovascular: Normal rate, regular rhythm and normal heart sounds.   Pulmonary/Chest: Effort normal and breath sounds normal. No respiratory distress. He has no wheezes.  Abdominal: Soft. Bowel sounds are normal. There is no tenderness. There is no guarding.  Musculoskeletal: Normal range of motion. He exhibits no edema.  Neurological: He is alert and oriented to person, place, and time.  AAOx3, answering questions appropriately; equal strength UE and LE bilaterally; CN grossly intact; moves all extremities appropriately without ataxia; no focal neuro deficits or facial asymmetry  appreciated  Skin: Skin is warm and dry. He is not diaphoretic.  Psychiatric: He has a normal mood and affect.  Nursing note and vitals reviewed.   ED Course  Procedures (including critical care time) Labs Review Labs Reviewed  CBC WITH DIFFERENTIAL/PLATELET - Abnormal; Notable for the following:    RBC 3.78 (*)    Hemoglobin 11.7 (*)    HCT 36.4 (*)    Monocytes Relative 16 (*)    All other components within normal limits  BASIC METABOLIC PANEL - Abnormal; Notable for the following:    Glucose, Bld 276 (*)    BUN 28 (*)    Calcium 8.8 (*)    All other components within normal limits  URINALYSIS, ROUTINE W REFLEX MICROSCOPIC (NOT AT [facility]) - Abnormal; Notable for the following:    Color, Urine AMBER (*)    Glucose, UA 500 (*)    All other  components within normal limits  CBG MONITORING, ED - Abnormal; Notable for the following:    Glucose-Capillary 238 (*)    All other components within normal limits  I-STAT TROPOININ, ED    Imaging Review Dg Chest 2 View  05/17/2015   CLINICAL DATA:  Hypotension  EXAM: CHEST  2 VIEW  COMPARISON:  None.  FINDINGS: Borderline cardiomegaly. Normal vascularity. Left subclavian AICD device is in place with its leads in the right atrium and right ventricle. Lungs are clear. No pneumothorax.  IMPRESSION: No active cardiopulmonary disease.   Electronically Signed   By: Jolaine Click M.D.   On: 05/17/2015 13:26     EKG Interpretation   Date/Time:  Sunday May 17 2015 13:38:46 EDT Ventricular Rate:  84 PR Interval:  177 QRS Duration: 122 QT Interval:  387 QTC Calculation: 457 R Axis:   93 Text Interpretation:  Sinus rhythm LAE, consider biatrial enlargement Left  bundle branch block Confirmed by ZAMMIT  MD, JOSEPH [phone]) on 05/17/2015  1:48:57 PM      MDM   Final diagnoses:  Fatigue  Hyperglycemia   69 year old male here for generalized fatigue per niece, however patient without formal complaints. Patient's niece states over the past  week he has had decreased energy compared to his baseline. He has not left the house in one week which is atypical for him. Patient states "I just feel like going anywhere". There've been no changes in his mental status. No fevers or chills.  Patient afebrile, nontoxic. CBG here 238.  BP is stable. Patient was hospitalized 2 months ago for anemia requiring blood transfusion-- denies recent bloody stools but has been off his Fe+ for a few days.  Will check EKG, basic labs, CXR, urine.  EKG NSR without acute ischemic changes.  Lab work is overall reassuring-- glucose 276 without evidence of DKA, bicarb and anion gap remain WNL.  H/H stable.  CXR is clear.  U/a non-infectious.  Patient continues to deny any complaints.  Will re-start iron supplementation [facility]ID per [facility] orders.  Encouraged to continue to monitor glucose and BP at home.  FU with PCP.  Discussed plan with patient, he/she acknowledged understanding and agreed with plan of care.  Return precautions given for new or worsening symptoms.  Case discussed with attending physician, Dr. Estell Harpin, who agrees with assessment and plan of care.  Garlon Hatchet, PA-C 05/17/15 1522  Bethann Berkshire, MD 05/19/15 [phone]

## 2015-05-17 NOTE — ED Notes (Signed)
Pt's family member states that pt has relatively well regulated blood sugars.  This morning CBG was 268.  Family member is concerned because his BP is normally 113 systolic but today it was 110/52.  Pt is A&Ox 4.

## 2015-05-17 NOTE — Discharge Instructions (Signed)
Resume taking your iron supplements as directed-- i have re-written prescription for you. Keep close check of your blood sugar and blood pressure. Follow-up with your primary care physician. Return to the ED for new or worsening symptoms.

## 2015-05-17 NOTE — ED Notes (Signed)
Pt's niece notified pt is read for discharge, family member who answered the  up phone sts she is on her way here.

## 2015-06-29 DIAGNOSIS — I255 Ischemic cardiomyopathy: Secondary | ICD-10-CM

## 2015-06-29 HISTORY — DX: Ischemic cardiomyopathy: I25.5

## 2015-07-07 ENCOUNTER — Observation Stay (HOSPITAL_COMMUNITY): Payer: Non-veteran care

## 2015-07-07 ENCOUNTER — Emergency Department (HOSPITAL_COMMUNITY): Payer: Non-veteran care

## 2015-07-07 ENCOUNTER — Encounter (HOSPITAL_COMMUNITY): Payer: Self-pay | Admitting: Emergency Medicine

## 2015-07-07 ENCOUNTER — Inpatient Hospital Stay (HOSPITAL_COMMUNITY)
Admission: EM | Admit: 2015-07-07 | Discharge: 2015-07-13 | DRG: 287 | Disposition: A | Discharge status: Home / Self Care | Payer: Non-veteran care | Attending: Internal Medicine | Admitting: Internal Medicine

## 2015-07-07 DIAGNOSIS — I5189 Other ill-defined heart diseases: Secondary | ICD-10-CM | POA: Diagnosis present

## 2015-07-07 DIAGNOSIS — I5021 Acute systolic (congestive) heart failure: Secondary | ICD-10-CM | POA: Diagnosis not present

## 2015-07-07 DIAGNOSIS — E0590 Thyrotoxicosis, unspecified without thyrotoxic crisis or storm: Secondary | ICD-10-CM | POA: Diagnosis present

## 2015-07-07 DIAGNOSIS — I5043 Acute on chronic combined systolic (congestive) and diastolic (congestive) heart failure: Principal | ICD-10-CM | POA: Diagnosis present

## 2015-07-07 DIAGNOSIS — Z87891 Personal history of nicotine dependence: Secondary | ICD-10-CM | POA: Diagnosis not present

## 2015-07-07 DIAGNOSIS — K5900 Constipation, unspecified: Secondary | ICD-10-CM | POA: Diagnosis present

## 2015-07-07 DIAGNOSIS — R7989 Other specified abnormal findings of blood chemistry: Secondary | ICD-10-CM | POA: Diagnosis present

## 2015-07-07 DIAGNOSIS — I472 Ventricular tachycardia: Secondary | ICD-10-CM | POA: Diagnosis present

## 2015-07-07 DIAGNOSIS — I350 Nonrheumatic aortic (valve) stenosis: Secondary | ICD-10-CM | POA: Diagnosis present

## 2015-07-07 DIAGNOSIS — I739 Peripheral vascular disease, unspecified: Secondary | ICD-10-CM | POA: Diagnosis present

## 2015-07-07 DIAGNOSIS — I5033 Acute on chronic diastolic (congestive) heart failure: Secondary | ICD-10-CM | POA: Diagnosis not present

## 2015-07-07 DIAGNOSIS — Z7982 Long term (current) use of aspirin: Secondary | ICD-10-CM | POA: Diagnosis not present

## 2015-07-07 DIAGNOSIS — I2510 Atherosclerotic heart disease of native coronary artery without angina pectoris: Secondary | ICD-10-CM | POA: Diagnosis present

## 2015-07-07 DIAGNOSIS — I272 Other secondary pulmonary hypertension: Secondary | ICD-10-CM | POA: Diagnosis present

## 2015-07-07 DIAGNOSIS — I509 Heart failure, unspecified: Secondary | ICD-10-CM

## 2015-07-07 DIAGNOSIS — I493 Ventricular premature depolarization: Secondary | ICD-10-CM | POA: Diagnosis present

## 2015-07-07 DIAGNOSIS — R0602 Shortness of breath: Secondary | ICD-10-CM | POA: Diagnosis present

## 2015-07-07 DIAGNOSIS — I10 Essential (primary) hypertension: Secondary | ICD-10-CM | POA: Diagnosis not present

## 2015-07-07 DIAGNOSIS — E1121 Type 2 diabetes mellitus with diabetic nephropathy: Secondary | ICD-10-CM | POA: Diagnosis present

## 2015-07-07 DIAGNOSIS — Z794 Long term (current) use of insulin: Secondary | ICD-10-CM

## 2015-07-07 DIAGNOSIS — R609 Edema, unspecified: Secondary | ICD-10-CM

## 2015-07-07 DIAGNOSIS — Z791 Long term (current) use of non-steroidal anti-inflammatories (NSAID): Secondary | ICD-10-CM

## 2015-07-07 DIAGNOSIS — N183 Chronic kidney disease, stage 3 (moderate): Secondary | ICD-10-CM | POA: Diagnosis present

## 2015-07-07 DIAGNOSIS — R946 Abnormal results of thyroid function studies: Secondary | ICD-10-CM | POA: Diagnosis not present

## 2015-07-07 DIAGNOSIS — I480 Paroxysmal atrial fibrillation: Secondary | ICD-10-CM | POA: Diagnosis not present

## 2015-07-07 DIAGNOSIS — R0600 Dyspnea, unspecified: Secondary | ICD-10-CM | POA: Diagnosis not present

## 2015-07-07 DIAGNOSIS — Z95810 Presence of automatic (implantable) cardiac defibrillator: Secondary | ICD-10-CM | POA: Diagnosis present

## 2015-07-07 DIAGNOSIS — R600 Localized edema: Secondary | ICD-10-CM | POA: Diagnosis not present

## 2015-07-07 DIAGNOSIS — Z951 Presence of aortocoronary bypass graft: Secondary | ICD-10-CM | POA: Diagnosis not present

## 2015-07-07 DIAGNOSIS — IMO0001 Reserved for inherently not codable concepts without codable children: Secondary | ICD-10-CM

## 2015-07-07 DIAGNOSIS — I639 Cerebral infarction, unspecified: Secondary | ICD-10-CM | POA: Diagnosis not present

## 2015-07-07 DIAGNOSIS — Z8673 Personal history of transient ischemic attack (TIA), and cerebral infarction without residual deficits: Secondary | ICD-10-CM | POA: Diagnosis present

## 2015-07-07 DIAGNOSIS — R0989 Other specified symptoms and signs involving the circulatory and respiratory systems: Secondary | ICD-10-CM | POA: Diagnosis present

## 2015-07-07 DIAGNOSIS — I519 Heart disease, unspecified: Secondary | ICD-10-CM | POA: Diagnosis not present

## 2015-07-07 DIAGNOSIS — G4733 Obstructive sleep apnea (adult) (pediatric): Secondary | ICD-10-CM | POA: Diagnosis not present

## 2015-07-07 DIAGNOSIS — E785 Hyperlipidemia, unspecified: Secondary | ICD-10-CM | POA: Diagnosis present

## 2015-07-07 DIAGNOSIS — N289 Disorder of kidney and ureter, unspecified: Secondary | ICD-10-CM

## 2015-07-07 DIAGNOSIS — Z79899 Other long term (current) drug therapy: Secondary | ICD-10-CM | POA: Diagnosis not present

## 2015-07-07 DIAGNOSIS — E1151 Type 2 diabetes mellitus with diabetic peripheral angiopathy without gangrene: Secondary | ICD-10-CM | POA: Diagnosis present

## 2015-07-07 DIAGNOSIS — I255 Ischemic cardiomyopathy: Secondary | ICD-10-CM | POA: Diagnosis present

## 2015-07-07 DIAGNOSIS — I69951 Hemiplegia and hemiparesis following unspecified cerebrovascular disease affecting right dominant side: Secondary | ICD-10-CM | POA: Diagnosis not present

## 2015-07-07 DIAGNOSIS — E1122 Type 2 diabetes mellitus with diabetic chronic kidney disease: Secondary | ICD-10-CM | POA: Diagnosis present

## 2015-07-07 DIAGNOSIS — Z7902 Long term (current) use of antithrombotics/antiplatelets: Secondary | ICD-10-CM

## 2015-07-07 DIAGNOSIS — I129 Hypertensive chronic kidney disease with stage 1 through stage 4 chronic kidney disease, or unspecified chronic kidney disease: Secondary | ICD-10-CM | POA: Diagnosis present

## 2015-07-07 HISTORY — DX: Presence of automatic (implantable) cardiac defibrillator: Z95.810

## 2015-07-07 HISTORY — DX: Nonrheumatic aortic (valve) stenosis: I35.0

## 2015-07-07 HISTORY — DX: Type 2 diabetes mellitus with other diabetic kidney complication: E11.29

## 2015-07-07 HISTORY — DX: Paroxysmal atrial fibrillation: I48.0

## 2015-07-07 HISTORY — DX: Ischemic cardiomyopathy: I25.5

## 2015-07-07 LAB — CBC
HCT: 36 % — ABNORMAL LOW (ref 39.0–52.0)
HEMOGLOBIN: 11 g/dL — AB (ref 13.0–17.0)
MCH: 28.9 pg (ref 26.0–34.0)
MCHC: 30.6 g/dL (ref 30.0–36.0)
MCV: 94.7 fL (ref 78.0–100.0)
Platelets: 137 10*3/uL — ABNORMAL LOW (ref 150–400)
RBC: 3.8 MIL/uL — ABNORMAL LOW (ref 4.22–5.81)
RDW: 16.6 % — AB (ref 11.5–15.5)
WBC: 6.1 10*3/uL (ref 4.0–10.5)

## 2015-07-07 LAB — BASIC METABOLIC PANEL WITH GFR
Anion gap: 4 — ABNORMAL LOW (ref 5–15)
BUN: 23 mg/dL — ABNORMAL HIGH (ref 6–20)
CO2: 28 mmol/L (ref 22–32)
Calcium: 9.1 mg/dL (ref 8.9–10.3)
Chloride: 107 mmol/L (ref 101–111)
Creatinine, Ser: 1.18 mg/dL (ref 0.61–1.24)
GFR calc Af Amer: >60 mL/min
GFR calc non Af Amer: >60 mL/min
Glucose, Bld: 115 mg/dL — ABNORMAL HIGH (ref 65–99)
Potassium: 5.1 mmol/L (ref 3.5–5.1)
Sodium: 139 mmol/L (ref 135–145)

## 2015-07-07 LAB — BRAIN NATRIURETIC PEPTIDE: B Natriuretic Peptide: 670.5 pg/mL — ABNORMAL HIGH (ref 0.0–100.0)

## 2015-07-07 LAB — HEPATIC FUNCTION PANEL
ALT: 27 U/L (ref 17–63)
AST: 36 U/L (ref 15–41)
Albumin: 3.6 g/dL (ref 3.5–5.0)
Alkaline Phosphatase: 103 U/L (ref 38–126)
Bilirubin, Direct: 0.1 mg/dL (ref 0.1–0.5)
Indirect Bilirubin: 0.6 mg/dL (ref 0.3–0.9)
TOTAL PROTEIN: 8.2 g/dL — AB (ref 6.5–8.1)
Total Bilirubin: 0.7 mg/dL (ref 0.3–1.2)

## 2015-07-07 LAB — I-STAT TROPONIN, ED: Troponin i, poc: 0.01 ng/mL (ref 0.00–0.08)

## 2015-07-07 LAB — CREATININE, SERUM
Creatinine, Ser: 1.22 mg/dL (ref 0.61–1.24)
GFR, EST NON AFRICAN AMERICAN: 58 mL/min — AB (ref >60)

## 2015-07-07 LAB — TROPONIN I
TROPONIN I: 0.06 ng/mL — AB (ref <0.031)
TROPONIN I: 0.06 ng/mL — AB (ref <0.031)

## 2015-07-07 LAB — GLUCOSE, CAPILLARY
GLUCOSE-CAPILLARY: 150 mg/dL — AB (ref 65–99)
Glucose-Capillary: 107 mg/dL — ABNORMAL HIGH (ref 65–99)

## 2015-07-07 LAB — TSH: TSH: 0.037 u[IU]/mL — ABNORMAL LOW (ref 0.350–4.500)

## 2015-07-07 MED ORDER — FUROSEMIDE 10 MG/ML IJ SOLN
40.0000 mg | INTRAMUSCULAR | Status: AC
  Administered 2015-07-07: 40 mg via INTRAVENOUS
  Filled 2015-07-07: qty 4

## 2015-07-07 MED ORDER — ATORVASTATIN CALCIUM 40 MG PO TABS
40.0000 mg | ORAL_TABLET | Freq: Every day | ORAL | Status: DC
  Administered 2015-07-07 – 2015-07-12 (×6): 40 mg via ORAL
  Filled 2015-07-07 (×7): qty 1

## 2015-07-07 MED ORDER — INSULIN GLARGINE 100 UNIT/ML ~~LOC~~ SOLN
20.0000 [IU] | Freq: Two times a day (BID) | SUBCUTANEOUS | Status: DC
  Administered 2015-07-07 – 2015-07-13 (×10): 20 [IU] via SUBCUTANEOUS
  Filled 2015-07-07 (×17): qty 0.2

## 2015-07-07 MED ORDER — LISINOPRIL 10 MG PO TABS
10.0000 mg | ORAL_TABLET | Freq: Every day | ORAL | Status: AC
  Administered 2015-07-08 – 2015-07-09 (×2): 10 mg via ORAL
  Filled 2015-07-07 (×2): qty 1

## 2015-07-07 MED ORDER — ASPIRIN 81 MG PO CHEW
81.0000 mg | CHEWABLE_TABLET | Freq: Every day | ORAL | Status: DC
  Administered 2015-07-08 – 2015-07-13 (×6): 81 mg via ORAL
  Filled 2015-07-07 (×6): qty 1

## 2015-07-07 MED ORDER — SENNOSIDES-DOCUSATE SODIUM 8.6-50 MG PO TABS
1.0000 | ORAL_TABLET | Freq: Two times a day (BID) | ORAL | Status: DC
  Administered 2015-07-07 – 2015-07-12 (×11): 1 via ORAL
  Filled 2015-07-07 (×14): qty 1

## 2015-07-07 MED ORDER — INSULIN ASPART 100 UNIT/ML ~~LOC~~ SOLN
0.0000 [IU] | Freq: Three times a day (TID) | SUBCUTANEOUS | Status: DC
  Administered 2015-07-08: 2 [IU] via SUBCUTANEOUS
  Administered 2015-07-08: 3 [IU] via SUBCUTANEOUS
  Administered 2015-07-08: 2 [IU] via SUBCUTANEOUS
  Administered 2015-07-09 (×2): 3 [IU] via SUBCUTANEOUS
  Administered 2015-07-09 – 2015-07-10 (×2): 2 [IU] via SUBCUTANEOUS
  Administered 2015-07-11 – 2015-07-13 (×6): 3 [IU] via SUBCUTANEOUS
  Administered 2015-07-13: 2 [IU] via SUBCUTANEOUS

## 2015-07-07 MED ORDER — CLOPIDOGREL BISULFATE 75 MG PO TABS
75.0000 mg | ORAL_TABLET | Freq: Every day | ORAL | Status: DC
  Administered 2015-07-08 – 2015-07-13 (×6): 75 mg via ORAL
  Filled 2015-07-07 (×6): qty 1

## 2015-07-07 MED ORDER — LISINOPRIL 20 MG PO TABS
40.0000 mg | ORAL_TABLET | Freq: Every day | ORAL | Status: DC
Start: 2015-07-08 — End: 2015-07-07

## 2015-07-07 MED ORDER — ENOXAPARIN SODIUM 40 MG/0.4ML ~~LOC~~ SOLN
40.0000 mg | SUBCUTANEOUS | Status: DC
  Administered 2015-07-07 – 2015-07-09 (×3): 40 mg via SUBCUTANEOUS
  Filled 2015-07-07 (×3): qty 0.4

## 2015-07-07 MED ORDER — FERROUS SULFATE 325 (65 FE) MG PO TABS
325.0000 mg | ORAL_TABLET | Freq: Three times a day (TID) | ORAL | Status: DC
  Administered 2015-07-07 – 2015-07-13 (×15): 325 mg via ORAL
  Filled 2015-07-07 (×17): qty 1

## 2015-07-07 MED ORDER — CARVEDILOL 3.125 MG PO TABS
3.1250 mg | ORAL_TABLET | Freq: Two times a day (BID) | ORAL | Status: DC
  Administered 2015-07-07 – 2015-07-13 (×10): 3.125 mg via ORAL
  Filled 2015-07-07 (×12): qty 1

## 2015-07-07 MED ORDER — FUROSEMIDE 10 MG/ML IJ SOLN
40.0000 mg | Freq: Two times a day (BID) | INTRAMUSCULAR | Status: AC
  Administered 2015-07-07 – 2015-07-09 (×5): 40 mg via INTRAVENOUS
  Filled 2015-07-07 (×5): qty 4

## 2015-07-07 MED ORDER — PANTOPRAZOLE SODIUM 40 MG PO TBEC
40.0000 mg | DELAYED_RELEASE_TABLET | Freq: Every day | ORAL | Status: DC
  Administered 2015-07-08 – 2015-07-13 (×5): 40 mg via ORAL
  Filled 2015-07-07 (×5): qty 1

## 2015-07-07 NOTE — ED Notes (Addendum)
Pt. C/o shortness of breath, some swelling in the face and neck. Recently prescribed atrovent for the shortness of breath. Pt does have hx of heart surgery.

## 2015-07-07 NOTE — Progress Notes (Signed)
Pt has refused CPAP for the night, RN aware.  RT to monitor and assess as needed.

## 2015-07-07 NOTE — H&P (Addendum)
History and Physical  Eric Mcguire ZOX:[id number] DOB: 02/22/1946 DOA: 07/07/2015  Referring physician: EDP PCP: No primary care provider on file.   Chief Complaint: sob, edema  HPI: Eric Mcguire is a 69 y.o. 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  Patient sought medical treatment at an outside facility Thursday 07/02/15. Diagnosed with pneumonia, prescribed Atrovent, and sent home. Shortness of breath and swelling continued to worsen, prompting patient to come to the ED. Patient states baseline weight is 222-225 pounds. Endorses shortness of breath, dyspnea on exertion, non-productive cough, wheezing, chest pain approximately 3 days ago, intermittent palpitations, and lower extremity and facial swelling. Denies orthopnea and PND. Patient wears a CPAP at night. Denies home O2 use.   In the ED, respiratory rate is 27 breaths/min otherwise vital signs are unremarkable. Serial troponins are negative thus far (0.06, 0.01). BNP was 670.5. CXR revealed cardiomegaly and mild edema. ECG negative for acute ischemia. U/A negative. Patient received 40mg  IV lasix. Based on clinical picture suspicious for acute CHF exacerbation. Patient was subsequently admitted for further evaluation and treatment.   Patient denies fever, no headache, no dizziness, no n/v. Reported being constipated, reported live by himself with good family support, still drives.   Review of Systems:  Detail per HPI, Review of systems are otherwise negative  Past Medical History  Diagnosis Date  . Stroke   . Hypertension   . Diabetes mellitus without complication   . Coronary artery disease   . Pacemaker   . S/P CABG x 3   .  Atrial fibrillation    Past Surgical History  Procedure Laterality Date  . Cardiac surgery     Social History:  reports that he quit smoking about 20 years ago. He does not have any smokeless tobacco history on file. He reports that he does not drink alcohol. His drug history is not on file. Patient lives at home & is able to participate in activities of daily living independently   Allergies  Allergen Reactions  . Prednisone Other (See Comments)    Stroke     No family history on file.    Prior to Admission medications   Medication Sig Start Date End Date Taking? Authorizing Provider  amLODipine (NORVASC) 10 MG tablet Take 10 mg by mouth daily.    Yes Historical Provider, MD  aspirin 81 MG tablet Take 81 mg by mouth daily.   Yes Historical Provider, MD  atorvastatin (LIPITOR) 80 MG tablet Take 40 mg by mouth daily.    Yes Historical Provider, MD  carvedilol (COREG) 3.125 MG tablet Take 3.125 mg by mouth 2 (two) times daily with a meal.   Yes Historical Provider, MD  cholecalciferol (VITAMIN D) 1000 UNITS tablet Take 1,000 Units by mouth daily.   Yes Historical Provider, MD  clopidogrel (PLAVIX) 75 MG tablet Take 75 mg by mouth daily.   Yes Historical Provider, MD  ferrous sulfate 325 (65 FE) MG tablet Take 1 tablet (325 mg total) by mouth 3 (three) times daily with meals. 05/17/15  Yes Garlon Hatchet, PA-C  ibuprofen (ADVIL,MOTRIN) 200 MG tablet Take 400-600 mg by mouth every 6 (six) hours as needed for headache or moderate pain.   Yes Historical Provider, MD  insulin NPH-regular Human (  NOVOLIN 70/30) (70-30) 100 UNIT/ML injection Inject 50-55 Units into the skin 2 (two) times daily. 55 units in the am and 50 units at night.   Yes Historical Provider, MD  lisinopril (PRINIVIL,ZESTRIL) 40 MG tablet Take 40 mg by mouth daily.   Yes Historical Provider, MD  magnesium oxide (MAG-OX) 400 MG tablet Take 400 mg by mouth daily.   Yes Historical Provider, MD  OVER THE COUNTER MEDICATION Take 10  mg by mouth daily as needed (allergies).   Yes Historical Provider, MD  pantoprazole (PROTONIX) 40 MG tablet Take 40 mg by mouth daily.   Yes Historical Provider, MD  triamcinolone (NASACORT ALLERGY 24HR) 55 MCG/ACT AERO nasal inhaler Place 2 sprays into the nose daily as needed (allergies).   Yes Historical Provider, MD  acetaminophen (TYLENOL 8 HOUR) 650 MG CR tablet Take 1 tablet (650 mg total) by mouth every 8 (eight) hours as needed for pain. Patient not taking: Reported on 05/17/2015 01/28/15   Emilia Beck, PA-C  cephALEXin (KEFLEX) 500 MG capsule Take 1 capsule (500 mg total) by mouth 4 (four) times daily. Patient not taking: Reported on 05/17/2015 01/28/15   Emilia Beck, PA-C    Physical Exam: BP 156/65 mmHg  Pulse 86  Temp(Src) 98.2 F (36.8 C) (Oral)  Resp 26  Ht  (1.854 m)  Wt 113.218 kg (249 lb 9.6 oz)  BMI 32.94 kg/m2  SpO2 95%  General:  NAD Eyes: PERRL ENT: unremarkable Neck: supple, no JVD Cardiovascular: RRR Respiratory: mild crackles at basis Abdomen: soft/ND/ND, positive bowel sounds Skin: no rash Musculoskeletal:  2-3+ edema bilateral lower extremity Psychiatric: calm/cooperative Neurologic: right hand/arm residual weakness from prior CVA, otherwise unremarkable          Labs on Admission:  Basic Metabolic Panel:  Recent Labs Lab 07/07/15 1023 07/07/15 1321  NA 139  --   K 5.1  --   CL 107  --   CO2 28  --   GLUCOSE 115*  --   BUN 23*  --   CREATININE 1.18 1.22  CALCIUM 9.1  --    Liver Function Tests:  Recent Labs Lab 07/07/15 1321  AST 36  ALT 27  ALKPHOS 103  BILITOT 0.7  PROT 8.2*  ALBUMIN 3.6   No results for input(s): LIPASE, AMYLASE in the last 168 hours. No results for input(s): AMMONIA in the last 168 hours. CBC:  Recent Labs Lab 07/07/15 1321  WBC 6.1  HGB 11.0*  HCT 36.0*  MCV 94.7  PLT 137*   Cardiac Enzymes:  Recent Labs Lab 07/07/15 1321  TROPONINI 0.06*    BNP (last 3 results)  Recent  Labs  07/07/15 1011  BNP 670.5*    ProBNP (last 3 results) No results for input(s): PROBNP in the last 8760 hours.  CBG:  Recent Labs Lab 07/07/15 1643  GLUCAP 107*    Radiological Exams on Admission: Dg Chest 2 View  07/07/2015   CLINICAL DATA:  Short of breath.  Anasarca.  Cough.  EXAM: CHEST  2 VIEW  COMPARISON:  05/17/2015  FINDINGS: Cardiac enlargement with vascular congestion which has progressed. Mild interstitial edema and small pleural effusions bilaterally.  AICD remains in good position.  IMPRESSION: Congestive heart failure with mild edema and small pleural effusions.   Electronically Signed   By: Marlan Palau M.D.   On: 07/07/2015 10:23    EKG: Independently reviewed. Sinus rhythm with pvc's, st/t depression, inversion inferior lateral leads  Assessment/Plan Present on Admission:  .  SOB (shortness of breath)  CHF exacerbation: no prior echo, currently on room air, NAD. Start iv lasix 40bid, continue coreg/lisinopril at lower dose. Strict i/o's. Lower extremity edema likely from chf, no DVT,  Cardiology consulted.  PAF: h/o pacemaker (patient not able to provide details), currently sinus rhythm, hight CHADs score, not on anticoagulation, reported being taken off anticoagulation after being transfused blood, reported no bleeding source was found at the time. Cardiology consulted.  HTN: continue coreg and lisinopril at lower dose, on iv lasix bid.  H/o cad, s/p cabg, reported intermittent chest pain, last three days ago, currently no pain, no acute EKG changes, cycle troponin. Continue coreg/statin/lisinopril/plavix/asa. Though patient reported he does not take plavix. Will need to verify with his pharmacy. Cardiology consulted.  IDDM2, check a1c, adjust insulin  OSA; home cpap   DVT prophylaxis: lovenox  Consultants: cardiology  Code Status: full , confirmed with patient and family  Family Communication:  Patient and neice  Disposition Plan: admit to  tele  Time spent:  Kenlyn Lose MD, PhD [facility] Pager [phone] If 7PM-7AM, please contact night-coverage at [email or web address], password [facility]

## 2015-07-07 NOTE — Progress Notes (Signed)
Echocardiogram 2D Echocardiogram has been performed.  Eric Mcguire 07/07/2015, 2:28 PM

## 2015-07-07 NOTE — H&P (Cosign Needed)
[facility] History and Physical  Aiken Withem ZOX:[id number] DOB: Apr 15, 1946 DOA: 07/07/2015  Referring physician: Dr. Estell Harpin PCP: No primary care provider on file.   Chief Complaint: Shortness of breath and edema  HPI: Eric Mcguire is a 69 y.o. 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 Patient initially noticed the swelling and shortness of breath approximately 2 weeks ago. Patient sought medical treatment at an outside facility Thursday 07/02/15. Diagnosed with pneumonia, prescribed Atrovent, and sent home. Shortness of breath and swelling continued to worsen, prompting patient to come to the ED. Patient states baseline weight is 222-225 pounds. Endorses shortness of breath, dyspnea on exertion, non-productive cough, wheezing, chest pain approximately 3 days ago, intermittent palpitations, and lower extremity and facial swelling. Denies orthopnea and PND.  Patient wears a CPAP at night. Denies home O2 use.   In the ED, respiratory rate is 27 breaths/min otherwise vital signs are unremarkable. Serial troponins are negative thus far (0.06, 0.01). BNP was 670.5. CXR revealed cardiomegaly and mild edema. ECG negative for acute ischemia. U/A negative. Patient received 40mg  IV lasix. Based on clinical picture suspicious for acute CHF exacerbation. Patient was subsequently admitted for further evaluation and treatment.   Review of Systems:  Constitutional: Denies fever, chills, night sweats.  GI: Endorses constipation. Denies abdominal pain, nausea, vomiting, or diarrhea.   GU: Endorses some urinary retention. Denies dysuria or hematuria.   Musculoskeletal: Ambulates without the need for assistive devices  Psych: No change in mood or affect. No depression or anxiety.   Past Medical History  Diagnosis  Date  . Stroke   . Hypertension   . Diabetes mellitus without complication   . Coronary artery disease   . Pacemaker   . S/P CABG x 3   . Atrial fibrillation    Past Surgical History  Procedure Laterality Date  . Cardiac surgery     Social History:  reports that he quit smoking about 20 years ago. He does not have any smokeless tobacco history on file. He reports that he does not drink alcohol. His drug history is not on file. Patient lives alone, ambulates without the use of assistive devices, and is independent in his ADLs. Reports no recent falls. Of note, his niece lives in the same apartment complex.   Allergies  Allergen Reactions  . Prednisone Other (See Comments)    Stroke     No family history on file. Family history of heart failure.   Prior to Admission medications   Medication Sig Start Date End Date Taking? Authorizing Provider  amLODipine (NORVASC) 10 MG tablet Take 10 mg by mouth daily.    Yes Historical Provider, MD  aspirin 81 MG tablet Take 81 mg by mouth daily.   Yes Historical Provider, MD  atorvastatin (LIPITOR) 80 MG tablet Take 40 mg by mouth daily.    Yes Historical Provider, MD  carvedilol (COREG) 3.125 MG tablet Take 3.125 mg by mouth 2 (two) times daily with a meal.   Yes Historical Provider, MD  cholecalciferol (VITAMIN D) 1000 UNITS tablet Take 1,000 Units by mouth daily.   Yes Historical Provider, MD  clopidogrel (PLAVIX) 75 MG tablet Take 75 mg by mouth daily.   Yes Historical Provider, MD  ferrous sulfate 325 (65 FE) MG tablet Take 1 tablet (325 mg total) by mouth 3 (three) times daily with meals. 05/17/15  Yes Garlon Hatchet, PA-C  ibuprofen (ADVIL,MOTRIN) 200 MG tablet Take 400-600 mg by mouth every 6 (six) hours as needed for headache or moderate pain.   Yes Historical Provider, MD  insulin NPH-regular Human (NOVOLIN 70/30) (70-30) 100 UNIT/ML injection Inject 50-55 Units into the skin 2 (two) times daily. 55 units in the am and 50 units at night.    Yes Historical Provider, MD  lisinopril (PRINIVIL,ZESTRIL) 40 MG tablet Take 40 mg by mouth daily.   Yes Historical Provider, MD  magnesium oxide (MAG-OX) 400 MG tablet Take 400 mg by mouth daily.   Yes Historical Provider, MD  OVER THE COUNTER MEDICATION Take 10 mg by mouth daily as needed (allergies).   Yes Historical Provider, MD  pantoprazole (PROTONIX) 40 MG tablet Take 40 mg by mouth daily.   Yes Historical Provider, MD  triamcinolone (NASACORT ALLERGY 24HR) 55 MCG/ACT AERO nasal inhaler Place 2 sprays into the nose daily as needed (allergies).   Yes Historical Provider, MD  acetaminophen (TYLENOL 8 HOUR) 650 MG CR tablet Take 1 tablet (650 mg total) by mouth every 8 (eight) hours as needed for pain. Patient not taking: Reported on 05/17/2015 01/28/15   Emilia Beck, PA-C  cephALEXin (KEFLEX) 500 MG capsule Take 1 capsule (500 mg total) by mouth 4 (four) times daily. Patient not taking: Reported on 05/17/2015 01/28/15   Emilia Beck, PA-C   Physical Exam: Filed Vitals:   07/07/15 1308 07/07/15 1139 07/07/15 1207 07/07/15 1237  BP:  121/64 129/69 156/65  Pulse:  82 86 86  Temp: 98.1 F (36.7 C)   98.2 F (36.8 C)  TempSrc: Oral   Oral  Resp:  Height:     (1.854 m)  Weight:    113.218 kg (249 lb 9.6 oz)  SpO2:  93% 94% 95%    Wt Readings from Last 3 Encounters:  07/07/15 113.218 kg (249 lb 9.6 oz)    General: Mr. Eric Mcguire is a 70yo male, appears stated age, resting in bed, slightly tachypneic but in NAD. He is alert and oriented, pleasant and cooperative.  Eyes: PERRL, clear conjunctiva, mild b/l edema noted in lids ENT: grossly normal hearing, lips & tongue, moist mucous membranes Neck: no LAD, masses or thyromegaly Cardiovascular: Irregularly irregular. No murmurs, gallops, or rubs. 2+ pitting edema b/l in lower extremities. Respiratory: Slightly tachypneic, rhonchi heard in bases b/l. No wheezes.   Abdomen: obese, non-tender, non-distended, positive bowel  sounds Skin: warm and dry, no rash or induration seen on limited exam Musculoskeletal: grossly normal tone BUE/BLE Psychiatric: grossly normal mood and affect, speech fluent and appropriate Neurologic: alert and oriented, grossly non-focal.          Labs on Admission:  Basic Metabolic Panel:  Recent Labs Lab 07/07/15 1023  NA 139  K 5.1  CL 107  CO2 28  GLUCOSE 115*  BUN 23*  CREATININE 1.18  CALCIUM 9.1   Liver Function Tests: No results for input(s): AST, ALT, ALKPHOS, BILITOT, PROT, ALBUMIN in the last 168 hours. No results for input(s): LIPASE, AMYLASE in the last 168 hours. No results for input(s): AMMONIA in the last 168 hours. CBC: No results for input(s): WBC, NEUTROABS, HGB, HCT, MCV, PLT in the last 168 hours. Cardiac Enzymes: No results for input(s): CKTOTAL, CKMB, CKMBINDEX, TROPONINI in the last 168 hours.  BNP (last 3 results)  Recent Labs  07/07/15 1011  BNP 670.5*    ProBNP (last 3 results) No results for input(s):  PROBNP in the last 8760 hours.  CBG: No results for input(s): GLUCAP in the last 168 hours.  Radiological Exams on Admission: Dg Chest 2 View  07/07/2015   CLINICAL DATA:  Short of breath.  Anasarca.  Cough.  EXAM: CHEST  2 VIEW  COMPARISON:  05/17/2015  FINDINGS: Cardiac enlargement with vascular congestion which has progressed. Mild interstitial edema and small pleural effusions bilaterally.  AICD remains in good position.  IMPRESSION: Congestive heart failure with mild edema and small pleural effusions.   Electronically Signed   By: Marlan Palau M.D.   On: 07/07/2015 10:23    EKG: Independently reviewed. Sinus rhythm with wultiform PVCs, left atrial enlargement, intraventricular conduction delays and t-wave inversion in inferior and  lateral leads.  Assessment/Plan Active Problems:   Congestive heart failure   SOB (shortness of breath)   1. Congestive Heart Failure (Acute) - Patient presents with two week history of  progressive shortness of breath and edema.  - Serial troponins negative thus far 0.06, 0.01 - BNP 670.5 - EKG showing no acute ischemia. Sinus rhythm with wultiform PVCs, left atrial enlargement, intraventricular conduction delays and t-wave inversion in inferior and  lateral leads. - CXR showing cardiomegaly with mild edema - Mostly likely cause is multifactorial in nature with cardiac history, hypertension, and diabetes.  - Check 2d echo - Check b/l venous duplex U/S due to lower extremity edema and pain, r/o DVT - Cardiology consultation pending - Telemetry monitoring - Continue IV lasix - Strict I/O and daily weight - Heart healthy, CHO modified diet  - Check BMP, electrolytes, TSH, liver function, and BNP in am  2. Atrial Fibrillation CHADSVASC score 5 - Patient was d/c'd from anticoagulants in February 2016 due to possible GI bleed.  - continue ASA, plavix, and coreg - telemetry monitoring - Check 2 d echo - Check PT/INR - Start lovenox  3. Hypertension - On admission BPs stable at 120s/60s - D/c Norvasc and decrease lisinopril and coreg dosages to maintain renal perfusion for adequate diuresis.  - Monitor BPs  4. Insulin Dependent diabetes - On admission, serum blood glucose 115 - Check hgbA1c - Lantus + SSI - carb modified, heart healthy diet - Monitor CBGs  5. H/o CVA s/p November 2015 - Continue ASA, lipitor, plavix  6. H/o CAD s/p CABG - Continue ASA, lipitor, plavix  Cardiology consultation pending. Cards masters paged 07/07/15 at ~1230  Code Status: FULL, discussed with patient DVT Prophylaxis: lovenox Family Communication: Niece, Jamesetta So, at bedside Disposition Plan: anticipate discharge home in 2-3 days  Time spent: 45 minutes  Deborha Payment, PA-Student   [facility]

## 2015-07-07 NOTE — ED Provider Notes (Signed)
CSN: [id number]     Arrival date & time 07/07/15  4098 History   First MD Initiated Contact with Patient 07/07/15 [phone]     Chief Complaint  Patient presents with  . Shortness of Breath     (Consider location/radiation/quality/duration/timing/severity/associated sxs/prior Treatment) HPI Comments: 69 year old male with past medical history including CAD status post CABG, CVA, type 2 diabetes, hypertension, A. fib who presents with shortness of breath and swelling. The patient states that approximately 4 days ago, he began noticing swelling in his right hand and bilateral feet as well as mild swelling on his face. He went to an outside hospital for evaluation and they diagnosed him with pneumonia. He was sent home on Atrovent which she has been using but his symptoms have become worse. His swelling has spread to his abdomen and worsened on his face. His shortness of breath has also progressed. He endorses paroxysmal nocturnal dyspnea. He denies orthopnea. He denies any associated chest pain. No fevers, vomiting, diarrhea, abdominal pain, or urinary symptoms. The shortness of breath was gradual in onset. No history of blood clots. No recent travel.  Patient is a 69 y.o. male presenting with shortness of breath. The history is provided by the patient and a relative.  Shortness of Breath   Past Medical History  Diagnosis Date  . Stroke   . Hypertension   . Diabetes mellitus without complication   . Coronary artery disease   . Pacemaker   . S/P CABG x 3   . Atrial fibrillation    Past Surgical History  Procedure Laterality Date  . Cardiac surgery     No family history on file. History  Substance Use Topics  . Smoking status: Former Smoker    Quit date: 12/04/1994  . Smokeless tobacco: Not on file  . Alcohol Use: No    Review of Systems  Respiratory: Positive for shortness of breath.    10 Systems reviewed and are negative for acute change except as noted in the HPI.    Allergies   Prednisone  Home Medications   Prior to Admission medications   Medication Sig Start Date End Date Taking? Authorizing Provider  amLODipine (NORVASC) 10 MG tablet Take 10 mg by mouth daily.    Yes Historical Provider, MD  aspirin 81 MG tablet Take 81 mg by mouth daily.   Yes Historical Provider, MD  atorvastatin (LIPITOR) 80 MG tablet Take 40 mg by mouth daily.    Yes Historical Provider, MD  carvedilol (COREG) 3.125 MG tablet Take 3.125 mg by mouth 2 (two) times daily with a meal.   Yes Historical Provider, MD  cholecalciferol (VITAMIN D) 1000 UNITS tablet Take 1,000 Units by mouth daily.   Yes Historical Provider, MD  clopidogrel (PLAVIX) 75 MG tablet Take 75 mg by mouth daily.   Yes Historical Provider, MD  ferrous sulfate 325 (65 FE) MG tablet Take 1 tablet (325 mg total) by mouth 3 (three) times daily with meals. 05/17/15  Yes Garlon Hatchet, PA-C  ibuprofen (ADVIL,MOTRIN) 200 MG tablet Take 400-600 mg by mouth every 6 (six) hours as needed for headache or moderate pain.   Yes Historical Provider, MD  insulin NPH-regular Human (NOVOLIN 70/30) (70-30) 100 UNIT/ML injection Inject 50-55 Units into the skin 2 (two) times daily. 55 units in the am and 50 units at night.   Yes Historical Provider, MD  lisinopril (PRINIVIL,ZESTRIL) 40 MG tablet Take 40 mg by mouth daily.   Yes Historical Provider, MD  magnesium  oxide (MAG-OX) 400 MG tablet Take 400 mg by mouth daily.   Yes Historical Provider, MD  OVER THE COUNTER MEDICATION Take 10 mg by mouth daily as needed (allergies).   Yes Historical Provider, MD  pantoprazole (PROTONIX) 40 MG tablet Take 40 mg by mouth daily.   Yes Historical Provider, MD  triamcinolone (NASACORT ALLERGY 24HR) 55 MCG/ACT AERO nasal inhaler Place 2 sprays into the nose daily as needed (allergies).   Yes Historical Provider, MD  acetaminophen (TYLENOL 8 HOUR) 650 MG CR tablet Take 1 tablet (650 mg total) by mouth every 8 (eight) hours as needed for pain. Patient not taking:  Reported on 05/17/2015 01/28/15   Emilia Beck, PA-C  cephALEXin (KEFLEX) 500 MG capsule Take 1 capsule (500 mg total) by mouth 4 (four) times daily. Patient not taking: Reported on 05/17/2015 01/28/15   Emilia Beck, PA-C   BP 165/68 mmHg  Pulse 84  Temp(Src) 98.1 F (36.7 C) (Oral)  Resp 18  SpO2 92% Physical Exam  Constitutional: He is oriented to person, place, and time. He appears well-developed and well-nourished.  Mildly dyspneic but NAD  HENT:  Head: Normocephalic and atraumatic.  Moist mucous membranes; symmetric edema of b/l eyelids   Eyes: Conjunctivae are normal. Pupils are equal, round, and reactive to light.  Neck: Neck supple.  Cardiovascular: Normal rate and regular rhythm.   2/6 systolic murmur  Pulmonary/Chest: No stridor.  Mildly increased WOB w/ prolonged expiratory phase; b/l diminished breath sounds with faint crackles b/l bases, R>L  Abdominal: Soft. Bowel sounds are normal. He exhibits no distension. There is no tenderness.  Pitting edema abdomen  Musculoskeletal:  3+ pitting edema b/l LE  Neurological: He is alert and oriented to person, place, and time.  Fluent speech  Skin: Skin is warm and dry.  Psychiatric: He has a normal mood and affect. Judgment normal.  Nursing note and vitals reviewed.   ED Course  Procedures (including critical care time) Labs Review Labs Reviewed  BASIC METABOLIC PANEL - Abnormal; Notable for the following:    Glucose, Bld 115 (*)    BUN 23 (*)    Anion gap 4 (*)    All other components within normal limits  BRAIN NATRIURETIC PEPTIDE - Abnormal; Notable for the following:    B Natriuretic Peptide 670.5 (*)    All other components within normal limits  I-STAT TROPOININ, ED    Imaging Review Dg Chest 2 View  07/07/2015   CLINICAL DATA:  Short of breath.  Anasarca.  Cough.  EXAM: CHEST  2 VIEW  COMPARISON:  05/17/2015  FINDINGS: Cardiac enlargement with vascular congestion which has progressed. Mild interstitial  edema and small pleural effusions bilaterally.  AICD remains in good position.  IMPRESSION: Congestive heart failure with mild edema and small pleural effusions.   Electronically Signed   By: Marlan Palau M.D.   On: 07/07/2015 10:23     EKG Interpretation   Date/Time:  Tuesday July 07 2015 09:33:30 EDT Ventricular Rate:  87 PR Interval:  171 QRS Duration: 124 QT Interval:  391 QTC Calculation: 470 R Axis:   95 Text Interpretation:  Right and left arm electrode reversal,  interpretation assumes no reversal Sinus rhythm Multiform ventricular  premature complexes Probable left atrial enlargement Nonspecific  intraventricular conduction delay Repol abnrm suggests ischemia,  anterolateral No significant change since last tracing Confirmed by LITTLE  MD, RACHEL ([id number]) on 07/07/2015 9:50:43 AM      MDM   Final diagnoses:  Acute  on chronic congestive heart failure, unspecified congestive heart failure type   69 year old male with extensive medical history who presents with several days of gradually worsening shortness of breath at rest associated with diffuse swelling in bilateral lower extremities, right hand, and face. Patient with increased work of breathing but no acute distress at presentation. Vital signs notable for O2 sat 92% on room air. Patient had diffuse anasarca on exam with diminished breath sounds and faint crackles bilaterally. EKG without evidence of acute ischemia. Obtained above lab work as well as chest x-ray to evaluate for heart failure.  Labs notable for BNP 670 and chest x-ray showing congestive heart failure with mild edema and small bilateral pleural effusions. Workup consistent with acute heart failure exacerbation. Gave the patient 40 mg IV Lasix and admitted to general medicine for diuresis.   Laurence Spates, MD 07/07/15 [phone]

## 2015-07-07 NOTE — Progress Notes (Signed)
Bilateral lower extremity venous duplex completed:  No evidence of DVT, superficial thrombosis, or Baker's cyst.   

## 2015-07-07 NOTE — ED Notes (Signed)
Went to collect blood samples ,RN was starting an IV

## 2015-07-08 ENCOUNTER — Encounter (HOSPITAL_COMMUNITY): Payer: Self-pay | Admitting: Cardiology

## 2015-07-08 DIAGNOSIS — Z951 Presence of aortocoronary bypass graft: Secondary | ICD-10-CM

## 2015-07-08 DIAGNOSIS — I739 Peripheral vascular disease, unspecified: Secondary | ICD-10-CM | POA: Diagnosis present

## 2015-07-08 DIAGNOSIS — N289 Disorder of kidney and ureter, unspecified: Secondary | ICD-10-CM

## 2015-07-08 DIAGNOSIS — R7989 Other specified abnormal findings of blood chemistry: Secondary | ICD-10-CM | POA: Diagnosis present

## 2015-07-08 DIAGNOSIS — Z95810 Presence of automatic (implantable) cardiac defibrillator: Secondary | ICD-10-CM | POA: Diagnosis present

## 2015-07-08 DIAGNOSIS — I5189 Other ill-defined heart diseases: Secondary | ICD-10-CM | POA: Diagnosis present

## 2015-07-08 DIAGNOSIS — Z8673 Personal history of transient ischemic attack (TIA), and cerebral infarction without residual deficits: Secondary | ICD-10-CM | POA: Diagnosis present

## 2015-07-08 DIAGNOSIS — E1121 Type 2 diabetes mellitus with diabetic nephropathy: Secondary | ICD-10-CM | POA: Diagnosis present

## 2015-07-08 DIAGNOSIS — I5021 Acute systolic (congestive) heart failure: Secondary | ICD-10-CM

## 2015-07-08 DIAGNOSIS — I255 Ischemic cardiomyopathy: Secondary | ICD-10-CM | POA: Diagnosis present

## 2015-07-08 DIAGNOSIS — I10 Essential (primary) hypertension: Secondary | ICD-10-CM | POA: Diagnosis present

## 2015-07-08 DIAGNOSIS — E785 Hyperlipidemia, unspecified: Secondary | ICD-10-CM | POA: Diagnosis present

## 2015-07-08 DIAGNOSIS — R0989 Other specified symptoms and signs involving the circulatory and respiratory systems: Secondary | ICD-10-CM | POA: Diagnosis present

## 2015-07-08 DIAGNOSIS — I5033 Acute on chronic diastolic (congestive) heart failure: Secondary | ICD-10-CM

## 2015-07-08 LAB — CBC
HCT: 33.9 % — ABNORMAL LOW (ref 39.0–52.0)
Hemoglobin: 10.6 g/dL — ABNORMAL LOW (ref 13.0–17.0)
MCH: 29.5 pg (ref 26.0–34.0)
MCHC: 31.3 g/dL (ref 30.0–36.0)
MCV: 94.4 fL (ref 78.0–100.0)
PLATELETS: 125 10*3/uL — AB (ref 150–400)
RBC: 3.59 MIL/uL — AB (ref 4.22–5.81)
RDW: 16.8 % — AB (ref 11.5–15.5)
WBC: 4.9 10*3/uL (ref 4.0–10.5)

## 2015-07-08 LAB — COMPREHENSIVE METABOLIC PANEL
ALBUMIN: 3.2 g/dL — AB (ref 3.5–5.0)
ALT: 21 U/L (ref 17–63)
AST: 31 U/L (ref 15–41)
Alkaline Phosphatase: 90 U/L (ref 38–126)
Anion gap: 8 (ref 5–15)
BILIRUBIN TOTAL: 0.5 mg/dL (ref 0.3–1.2)
BUN: 25 mg/dL — ABNORMAL HIGH (ref 6–20)
CALCIUM: 9.2 mg/dL (ref 8.9–10.3)
CO2: 27 mmol/L (ref 22–32)
Chloride: 103 mmol/L (ref 101–111)
Creatinine, Ser: 1.43 mg/dL — ABNORMAL HIGH (ref 0.61–1.24)
GFR calc Af Amer: 55 mL/min — ABNORMAL LOW (ref >60)
GFR, EST NON AFRICAN AMERICAN: 48 mL/min — AB (ref >60)
Glucose, Bld: 130 mg/dL — ABNORMAL HIGH (ref 65–99)
Potassium: 4.5 mmol/L (ref 3.5–5.1)
Sodium: 138 mmol/L (ref 135–145)
Total Protein: 7.1 g/dL (ref 6.5–8.1)

## 2015-07-08 LAB — GLUCOSE, CAPILLARY
GLUCOSE-CAPILLARY: 153 mg/dL — AB (ref 65–99)
GLUCOSE-CAPILLARY: 142 mg/dL — AB (ref 65–99)
GLUCOSE-CAPILLARY: 122 mg/dL — AB (ref 65–99)
Glucose-Capillary: 150 mg/dL — ABNORMAL HIGH (ref 65–99)

## 2015-07-08 LAB — PROTIME-INR
INR: 1.29 (ref 0.00–1.49)
Prothrombin Time: 16.2 seconds — ABNORMAL HIGH (ref 11.6–15.2)

## 2015-07-08 LAB — LIPID PANEL
CHOL/HDL RATIO: 3.2 ratio
Cholesterol: 99 mg/dL (ref 0–200)
HDL: 31 mg/dL — AB (ref >40)
LDL Cholesterol: 58 mg/dL (ref 0–99)
Triglycerides: 49 mg/dL (ref <150)
VLDL: 10 mg/dL (ref 0–40)

## 2015-07-08 LAB — BRAIN NATRIURETIC PEPTIDE: B Natriuretic Peptide: 732.5 pg/mL — ABNORMAL HIGH (ref 0.0–100.0)

## 2015-07-08 LAB — HEMOGLOBIN A1C
Hgb A1c MFr Bld: 6.6 % — ABNORMAL HIGH (ref 4.8–5.6)
MEAN PLASMA GLUCOSE: 143 mg/dL

## 2015-07-08 LAB — MAGNESIUM: MAGNESIUM: 1.9 mg/dL (ref 1.7–2.4)

## 2015-07-08 LAB — T4, FREE: Free T4: 1.28 ng/dL — ABNORMAL HIGH (ref 0.61–1.12)

## 2015-07-08 MED ORDER — ALBUTEROL SULFATE (2.5 MG/3ML) 0.083% IN NEBU
2.5000 mg | INHALATION_SOLUTION | Freq: Once | RESPIRATORY_TRACT | Status: AC
  Administered 2015-07-08: 2.5 mg via RESPIRATORY_TRACT
  Filled 2015-07-08: qty 3

## 2015-07-08 NOTE — Progress Notes (Signed)
[facility] PROGRESS NOTE  Jorma Tassinari ZOX:[id number] DOB: [birth year] DOA: 07/07/2015  [facility]  Brief HPI: 69 year old African-American male with a past medical history of diabetes, coronary artery disease status post CABG, ICD placement, history of stroke resented with complaints of shortness of breath. Found to have lower extremity edema and pulmonary edema. He was hospitalized for further management.  Past medical history:  Past Medical History  Diagnosis Date  . Stroke   . Hypertension   . Diabetes mellitus without complication   . Coronary artery disease   . Pacemaker   . S/P CABG x 3   . Atrial fibrillation     Consultants: Cardiology  Procedures:  2-D echocardiogram Study Conclusions - Left ventricle: The cavity size was mildly dilated. There wasmild concentric hypertrophy. Systolic function was severelyreduced. The estimated ejection fraction was in the range of 25%to 30%. Diffuse hypokinesis. There is akinesis of thebasal-midinferolateral and inferior myocardium. Features areconsistent with a pseudonormal left ventricular filling pattern,with concomitant abnormal relaxation and increased fillingpressure (grade 2 diastolic dysfunction). Doppler parameters areconsistent with high ventricular filling pressure. - Aortic valve: Visually there appears to be severe aorticstenosis. The mean gradient of is probably underestimateddue to low cardiac output from low EF. Recommend dobutaminestress echo if clinically indicated to assess further. Severediffuse thickening and calcification. Valve mobility wasrestricted. There was mild regurgitation. Valve area (VTI): 1.28cm^2. Valve area (Vmax): 1.11 cm^2. Valve area (Vmean): 1.11cm^2. - Mitral valve: There was mild regurgitation. - Left atrium: The atrium was severely dilated. - Right ventricle: The cavity size was moderately dilated. Wallthickness was normal. - Tricuspid valve: There was  mild-moderate regurgitation. - Pulmonary arteries: PA peak pressure: 60 mm Hg (S).   Lower extremity venous Dopplers No evidence of DVT, superficial thrombosis, or Baker's cyst.  Antibiotics: None  Subjective: Patient feels slightly better this morning. Denies any chest pain. No nausea, vomiting. Breathing is improving.  Objective: Vital Signs  Filed Vitals:   07/07/15 1758 07/07/15 2110 07/08/15 0449 07/08/15 0511  BP: 155/65 141/80 150/64   Pulse: 86 85 81   Temp:  98.2 F (36.8 C)    TempSrc:  Oral Oral   Resp:  22 22   Height:      Weight:    108.863 kg (240 lb)  SpO2:  97% 97%     Intake/Output Summary (Last 24 hours) at 07/08/15 0904 Last data filed at 07/08/15 0700  Gross per 24 hour  Intake    720 ml  Output   4750 ml  Net  -4030 ml   Filed Weights   07/07/15 1237 07/08/15 0511  Weight: 113.218 kg (249 lb 9.6 oz) 108.863 kg (240 lb)    General appearance: alert, cooperative and no distress Resp: Crackles bilateral bases. Minimal end expiratory wheezing bilaterally. Cardio: regular rate and rhythm, S1, S2 normal, no murmur, click, rub or gallop GI: soft, non-tender; bowel sounds normal; no masses,  no organomegaly Extremities: 1-2+ pitting edema bilateral lower extremities. Neurologic: No focal deficits  Lab Results:  Basic Metabolic Panel:  Recent Labs Lab 07/07/15 1023 07/07/15 1321 07/08/15 0447  NA 139  --  138  K 5.1  --  4.5  CL 107  --  103  CO2 28  --  27  GLUCOSE 115*  --  130*  BUN 23*  --  25*  CREATININE 1.18 1.22 1.43*  CALCIUM 9.1  --  9.2  MG  --   --  1.9   Liver Function  Tests:  Recent Labs Lab 07/07/15 1321 07/08/15 0447  AST 36 31  ALT 27 21  ALKPHOS 103 90  BILITOT 0.7 0.5  PROT 8.2* 7.1  ALBUMIN 3.6 3.2*   CBC:  Recent Labs Lab 07/07/15 1321 07/08/15 0447  WBC 6.1 4.9  HGB 11.0* 10.6*  HCT 36.0* 33.9*  MCV 94.7 94.4  PLT 137* 125*   Cardiac Enzymes:  Recent Labs Lab 07/07/15 1321 07/07/15 1930   TROPONINI 0.06* 0.06*   BNP (last 3 results)  Recent Labs  07/07/15 1011 07/08/15 0447  BNP 670.5* 732.5*    CBG:  Recent Labs Lab 07/07/15 1643 07/07/15 2109 07/08/15 0746  GLUCAP 107* 150* 153*    No results found for this or any previous visit (from the past 240 hour(s)).    Studies/Results: Dg Chest 2 View  07/07/2015   CLINICAL DATA:  Short of breath.  Anasarca.  Cough.  EXAM: CHEST  2 VIEW  COMPARISON:  05/17/2015  FINDINGS: Cardiac enlargement with vascular congestion which has progressed. Mild interstitial edema and small pleural effusions bilaterally.  AICD remains in good position.  IMPRESSION: Congestive heart failure with mild edema and small pleural effusions.   Electronically Signed   By: Marlan Palau M.D.   On: 07/07/2015 10:23    Medications:  Scheduled: . aspirin  81 mg Oral Daily  . atorvastatin  40 mg Oral Daily  . carvedilol  3.125 mg Oral BID WC  . clopidogrel  75 mg Oral Daily  . enoxaparin (LOVENOX) injection  40 mg Subcutaneous Q24H  . ferrous sulfate  325 mg Oral TID WC  . furosemide  40 mg Intravenous Q12H  . insulin aspart  0-15 Units Subcutaneous TID WC  . insulin glargine  20 Units Subcutaneous BID  . lisinopril  10 mg Oral Daily  . pantoprazole  40 mg Oral Daily  . senna-docusate  1 tablet Oral BID   Continuous:  PRN:  Assessment/Plan:  Principal Problem:   Acute on chronic diastolic CHF (congestive heart failure) Active Problems:   S/P CABG x 3- 2003 ([place])   Type 2 diabetes with nephropathy   CVA- DEC 2015 [facility])   Essential hypertension   Dyslipidemia   Renal insufficiency-stage 3- presumably chronic   ICD in place -BS-(implanted July 2013 [place])   Low TSH level    Acute systolic congestive heart failure Echocardiogram report as above. EF is 25-30%. Diffuse hypokinesis noted. Cardiology has been consulted. Minimal elevation in troponin is noted. Baseline EKG is abnormal. Patient denies any chest pain.  Continue with Lasix. Continue with ACE inhibitor. Strict ins and outs. Daily weights.  History of paroxysmal atrial fibrillation Apparently, has a pacemaker. He is currently not on anticoagulation. Apparently was taken off of it due to questionable bleeding/requiring blood transfusion.  Possible severe aortic stenosis This was suggested on the echocardiogram. Cardiology to address further.  History of diabetes mellitus type 2 on insulin Monitor CBGs. Continue Lantus and sliding scale coverage. HbA1c is 6.6.  History of coronary artery disease, status post CABG As noted above. He does have minimal elevation in troponin. Cardiology to address further. Continue antiplatelets agents. Continue beta blocker and statin. LDL is 58.  Essential hypertension Monitor BP closely.   History of sleep apnea CPAP  Low TSH Free T4 is 1.28 which is higher than upper range of normal. He could have hyperthyroidism. Will discuss with patient regarding symptoms. We will need to ask patient if he has been on amiodarone in the  past.  DVT Prophylaxis: Lovenox    Code Status: Full code  Family Communication: Discussed with the patient  Disposition Plan: Continue current management.    LOS: 1 day   [facility]  [facility] Pager [phone] 07/08/2015, 9:04 AM  If 7PM-7AM, please contact night-coverage at [email or web address], password [facility]

## 2015-07-08 NOTE — Progress Notes (Signed)
Pt refused CPAP for tonight. Pt states he does wear CPAP at home however, due to frequent trips to the bathroom from diuretic he prefers not to wear CPAP for now. RT will continue to monitor as needed.

## 2015-07-08 NOTE — Consult Note (Signed)
Reason for Consult:   CHF  Requesting Physician: [facility]  HPI: This is a 69 y.o. diabetic, AA, male, [place] veteran, followed in the past by [place] [facility]. He has a past medical history significant for CABG x 3 at the [place] in [place] in 2003. In July 2013 he had a BS duel chamber ICD placed at the [place] in [place]. In Dec 2015 he had a LCE in [place] and suffered a stroke with residual Rt hand weakness. He apparently moved here in Jan 2016. He lives alone in his own appartment. He has a niece nearby. There is mention of a history of AF and recent GI bleed prompting discontinuation of anticoagulants but the pt is a poor hisitorian and says he "doesn't know about that". The only records in [facility] are rehab notes from Jan 2016 and an ED visit from March and June. The pt told me this am he was just in the hospital at the [facility] last week for "swelling".   He is admitted now with CHF. He has improved after 4.2L diuresis. Echo is pending.  PMHx:  Past Medical History  Diagnosis Date  . Stroke   . Hypertension   . Diabetes mellitus without complication   . Coronary artery disease   . Pacemaker   . S/P CABG x 3   . Atrial fibrillation     Past Surgical History  Procedure Laterality Date  . Cardiac surgery      SOCHx:  reports that he quit smoking about 20 years ago. He does not have any smokeless tobacco history on file. He reports that he does not drink alcohol. His drug history is not on file.  FAMHx: No family history on file.  ALLERGIES: Allergies  Allergen Reactions  . Prednisone Other (See Comments)    Stroke     ROS: Pertinent items are noted in HPI. see H&P for complete details  HOME MEDICATIONS: Prior to Admission medications   Medication Sig Start Date End Date Taking? Authorizing Provider  amLODipine (NORVASC) 10 MG tablet Take 10 mg by mouth daily.    Yes Historical Provider, MD  aspirin 81 MG tablet Take 81  mg by mouth daily.   Yes Historical Provider, MD  atorvastatin (LIPITOR) 80 MG tablet Take 40 mg by mouth daily.    Yes Historical Provider, MD  carvedilol (COREG) 3.125 MG tablet Take 3.125 mg by mouth 2 (two) times daily with a meal.   Yes Historical Provider, MD  cholecalciferol (VITAMIN D) 1000 UNITS tablet Take 1,000 Units by mouth daily.   Yes Historical Provider, MD  clopidogrel (PLAVIX) 75 MG tablet Take 75 mg by mouth daily.   Yes Historical Provider, MD  ferrous sulfate 325 (65 FE) MG tablet Take 1 tablet (325 mg total) by mouth 3 (three) times daily with meals. 05/17/15  Yes Garlon Hatchet, PA-C  ibuprofen (ADVIL,MOTRIN) 200 MG tablet Take 400-600 mg by mouth every 6 (six) hours as needed for headache or moderate pain.   Yes Historical Provider, MD  insulin NPH-regular Human (NOVOLIN 70/30) (70-30) 100 UNIT/ML injection Inject 50-55 Units into the skin 2 (two) times daily. 55 units in the am and 50 units at night.   Yes Historical Provider, MD  lisinopril (PRINIVIL,ZESTRIL) 40 MG tablet Take 40 mg by mouth daily.   Yes Historical Provider, MD  magnesium oxide (MAG-OX) 400 MG tablet Take 400 mg by mouth daily.  Yes Historical Provider, MD  OVER THE COUNTER MEDICATION Take 10 mg by mouth daily as needed (allergies).   Yes Historical Provider, MD  pantoprazole (PROTONIX) 40 MG tablet Take 40 mg by mouth daily.   Yes Historical Provider, MD  triamcinolone (NASACORT ALLERGY 24HR) 55 MCG/ACT AERO nasal inhaler Place 2 sprays into the nose daily as needed (allergies).   Yes Historical Provider, MD  acetaminophen (TYLENOL 8 HOUR) 650 MG CR tablet Take 1 tablet (650 mg total) by mouth every 8 (eight) hours as needed for pain. Patient not taking: Reported on 05/17/2015 01/28/15   Emilia Beck, PA-C  cephALEXin (KEFLEX) 500 MG capsule Take 1 capsule (500 mg total) by mouth 4 (four) times daily. Patient not taking: Reported on 05/17/2015 01/28/15   Emilia Beck, [facility] MEDICATIONS: I  have reviewed the patient's current medications.  VITALS: Blood pressure 150/64, pulse 81, temperature 98.2 F (36.8 C), temperature source Oral, resp. rate 22, height 6\' 1"  (1.854 m), weight 240 lb (108.863 kg), SpO2 97 %.  PHYSICAL EXAM: General appearance: alert, cooperative and moderately obese Neck: no JVD and LCE scar, with bilateral bruits Lungs: rales 1/3 up on Lt and Rt base Heart: regular rate and rhythm and 2/6 MR murmur Abdomen: obese, non tender Extremities: 1+ bilateral edema Pulses: diminnished Skin: cool, dry Neurologic: Grossly normal, contraction Rt hand  LABS: Results for orders placed or performed during the hospital encounter of 07/07/15 (from the past 24 hour(s))  Brain natriuretic peptide     Status: Abnormal   Collection Time: 07/07/15 10:11 AM  Result Value Ref Range   B Natriuretic Peptide 670.5 (H) 0.0 - 100.0 pg/mL  I-stat troponin, ED     Status: None   Collection Time: 07/07/15 10:17 AM  Result Value Ref Range   Troponin i, poc 0.01 0.00 - 0.08 ng/mL   Comment 3          Basic metabolic panel     Status: Abnormal   Collection Time: 07/07/15 10:23 AM  Result Value Ref Range   Sodium 139 135 - 145 mmol/L   Potassium 5.1 3.5 - 5.1 mmol/L   Chloride 107 101 - 111 mmol/L   CO2 28 22 - 32 mmol/L   Glucose, Bld 115 (H) 65 - 99 mg/dL   BUN 23 (H) 6 - 20 mg/dL   Creatinine, Ser 1.61 0.61 - 1.24 mg/dL   Calcium 9.1 8.9 - 09.6 mg/dL   GFR calc non Af Amer >60 >60 mL/min   GFR calc Af Amer >60 >60 mL/min   Anion gap 4 (L) 5 - 15  CBC     Status: Abnormal   Collection Time: 07/07/15  1:21 PM  Result Value Ref Range   WBC 6.1 4.0 - 10.5 K/uL   RBC 3.80 (L) 4.22 - 5.81 MIL/uL   Hemoglobin 11.0 (L) 13.0 - 17.0 g/dL   HCT 04.5 (L) 40.9 - 81.1 %   MCV 94.7 78.0 - 100.0 fL   MCH 28.9 26.0 - 34.0 pg   MCHC 30.6 30.0 - 36.0 g/dL   RDW 91.4 (H) 78.2 - 95.6 %   Platelets 137 (L) 150 - 400 K/uL  Creatinine, serum     Status: Abnormal   Collection Time:  07/07/15  1:21 PM  Result Value Ref Range   Creatinine, Ser 1.22 0.61 - 1.24 mg/dL   GFR calc non Af Amer 58 (L) >60 mL/min   GFR calc Af Amer >60 >60 mL/min  Hemoglobin A1c     Status: Abnormal   Collection Time: 07/07/15  1:21 PM  Result Value Ref Range   Hgb A1c MFr Bld 6.6 (H) 4.8 - 5.6 %   Mean Plasma Glucose 143 mg/dL  TSH     Status: Abnormal   Collection Time: 07/07/15  1:21 PM  Result Value Ref Range   TSH 0.037 (L) 0.350 - 4.500 uIU/mL  Hepatic function panel     Status: Abnormal   Collection Time: 07/07/15  1:21 PM  Result Value Ref Range   Total Protein 8.2 (H) 6.5 - 8.1 g/dL   Albumin 3.6 3.5 - 5.0 g/dL   AST 36 15 - 41 U/L   ALT 27 17 - 63 U/L   Alkaline Phosphatase 103 38 - 126 U/L   Total Bilirubin 0.7 0.3 - 1.2 mg/dL   Bilirubin, Direct 0.1 0.1 - 0.5 mg/dL   Indirect Bilirubin 0.6 0.3 - 0.9 mg/dL  Troponin I (q 6hr x 3)     Status: Abnormal   Collection Time: 07/07/15  1:21 PM  Result Value Ref Range   Troponin I 0.06 (H) <0.031 ng/mL  Glucose, capillary     Status: Abnormal   Collection Time: 07/07/15  4:43 PM  Result Value Ref Range   Glucose-Capillary 107 (H) 65 - 99 mg/dL  Troponin I (q 6hr x 3)     Status: Abnormal   Collection Time: 07/07/15  7:30 PM  Result Value Ref Range   Troponin I 0.06 (H) <0.031 ng/mL  Glucose, capillary     Status: Abnormal   Collection Time: 07/07/15  9:09 PM  Result Value Ref Range   Glucose-Capillary 150 (H) 65 - 99 mg/dL  Comprehensive metabolic panel     Status: Abnormal   Collection Time: 07/08/15  4:47 AM  Result Value Ref Range   Sodium 138 135 - 145 mmol/L   Potassium 4.5 3.5 - 5.1 mmol/L   Chloride 103 101 - 111 mmol/L   CO2 27 22 - 32 mmol/L   Glucose, Bld 130 (H) 65 - 99 mg/dL   BUN 25 (H) 6 - 20 mg/dL   Creatinine, Ser 6.04 (H) 0.61 - 1.24 mg/dL   Calcium 9.2 8.9 - 54.0 mg/dL   Total Protein 7.1 6.5 - 8.1 g/dL   Albumin 3.2 (L) 3.5 - 5.0 g/dL   AST 31 15 - 41 U/L   ALT 21 17 - 63 U/L   Alkaline  Phosphatase 90 38 - 126 U/L   Total Bilirubin 0.5 0.3 - 1.2 mg/dL   GFR calc non Af Amer 48 (L) >60 mL/min   GFR calc Af Amer 55 (L) >60 mL/min   Anion gap 8 5 - 15  CBC     Status: Abnormal   Collection Time: 07/08/15  4:47 AM  Result Value Ref Range   WBC 4.9 4.0 - 10.5 K/uL   RBC 3.59 (L) 4.22 - 5.81 MIL/uL   Hemoglobin 10.6 (L) 13.0 - 17.0 g/dL   HCT 98.1 (L) 19.1 - 47.8 %   MCV 94.4 78.0 - 100.0 fL   MCH 29.5 26.0 - 34.0 pg   MCHC 31.3 30.0 - 36.0 g/dL   RDW 29.5 (H) 62.1 - 30.8 %   Platelets 125 (L) 150 - 400 K/uL  Protime-INR     Status: Abnormal   Collection Time: 07/08/15  4:47 AM  Result Value Ref Range   Prothrombin Time 16.2 (H) 11.6 - 15.2 seconds   INR 1.29  0.00 - 1.49  Brain natriuretic peptide     Status: Abnormal   Collection Time: 07/08/15  4:47 AM  Result Value Ref Range   B Natriuretic Peptide 732.5 (H) 0.0 - 100.0 pg/mL  Magnesium     Status: None   Collection Time: 07/08/15  4:47 AM  Result Value Ref Range   Magnesium 1.9 1.7 - 2.4 mg/dL  Glucose, capillary     Status: Abnormal   Collection Time: 07/08/15  7:46 AM  Result Value Ref Range   Glucose-Capillary 153 (H) 65 - 99 mg/dL    EKG: NSR, LBBB  IMAGING: Dg Chest 2 View  07/07/2015   CLINICAL DATA:  Short of breath.  Anasarca.  Cough.  EXAM: CHEST  2 VIEW  COMPARISON:  05/17/2015  FINDINGS: Cardiac enlargement with vascular congestion which has progressed. Mild interstitial edema and small pleural effusions bilaterally.  AICD remains in good position.  IMPRESSION: Congestive heart failure with mild edema and small pleural effusions.   Electronically Signed   By: Marlan Palau M.D.   On: 07/07/2015 10:23    IMPRESSION: Principal Problem:   Acute on chronic diastolic CHF (congestive heart failure) Active Problems:   S/P CABG x 3- 2003 ([place])   Type 2 diabetes with nephropathy   CVA- DEC 2015 [facility] [place])   Essential hypertension   Renal insufficiency-stage 3- presumably chronic   ICD  in place -BS-(implanted July 2013 [place])   Dyslipidemia   RECOMMENDATION: Pt is a poor historian and has multiple medical problems and has been through multiple medical systems.  He is now here and wants his care here. His ICD appears to be a BS duel chamber (not CRT) by CXR and I will get this interrogated. I will request records from [facility] and [place]. Continue IV Lasix, defer work up of low TSH to primary service.   Time Spent Directly with Patient: 60 minutes  Eric Mcguire [phone] beeper 07/08/2015, 8:31 AM   Agree with note written by Corine Shelter PAC  Pt with H/O IHD s/p remote CABG and dual chamber ICD. Other probs as outlined. Admitted with CHF. BNP moderately elevated. Diuresing. I/O neg. Clinically improving. 2D pending. On approp meds. Lungs clear. Minimal periph edema. Will get nutrition consult to educate him on a low salt heart healthy diet. Can prob transition to PO diuretics tomorrow will follow with you and arrange OP F/U. Will need an OP ischemic eval; as well.   Nanetta Batty 07/08/2015 12:37 PM

## 2015-07-09 ENCOUNTER — Encounter (HOSPITAL_COMMUNITY): Payer: Self-pay | Admitting: Cardiology

## 2015-07-09 DIAGNOSIS — I5043 Acute on chronic combined systolic (congestive) and diastolic (congestive) heart failure: Principal | ICD-10-CM

## 2015-07-09 DIAGNOSIS — IMO0001 Reserved for inherently not codable concepts without codable children: Secondary | ICD-10-CM

## 2015-07-09 DIAGNOSIS — E785 Hyperlipidemia, unspecified: Secondary | ICD-10-CM

## 2015-07-09 DIAGNOSIS — E0590 Thyrotoxicosis, unspecified without thyrotoxic crisis or storm: Secondary | ICD-10-CM

## 2015-07-09 DIAGNOSIS — I519 Heart disease, unspecified: Secondary | ICD-10-CM

## 2015-07-09 DIAGNOSIS — I255 Ischemic cardiomyopathy: Secondary | ICD-10-CM

## 2015-07-09 DIAGNOSIS — I350 Nonrheumatic aortic (valve) stenosis: Secondary | ICD-10-CM

## 2015-07-09 HISTORY — DX: Nonrheumatic aortic (valve) stenosis: I35.0

## 2015-07-09 LAB — BASIC METABOLIC PANEL
ANION GAP: 7 (ref 5–15)
BUN: 30 mg/dL — AB (ref 6–20)
CO2: 29 mmol/L (ref 22–32)
Calcium: 9.3 mg/dL (ref 8.9–10.3)
Chloride: 102 mmol/L (ref 101–111)
Creatinine, Ser: 1.35 mg/dL — ABNORMAL HIGH (ref 0.61–1.24)
GFR, EST AFRICAN AMERICAN: 59 mL/min — AB (ref >60)
GFR, EST NON AFRICAN AMERICAN: 51 mL/min — AB (ref >60)
Glucose, Bld: 140 mg/dL — ABNORMAL HIGH (ref 65–99)
Potassium: 4.4 mmol/L (ref 3.5–5.1)
Sodium: 138 mmol/L (ref 135–145)

## 2015-07-09 LAB — CBC
HCT: 36.3 % — ABNORMAL LOW (ref 39.0–52.0)
Hemoglobin: 11.5 g/dL — ABNORMAL LOW (ref 13.0–17.0)
MCH: 29.6 pg (ref 26.0–34.0)
MCHC: 31.7 g/dL (ref 30.0–36.0)
MCV: 93.6 fL (ref 78.0–100.0)
Platelets: 126 10*3/uL — ABNORMAL LOW (ref 150–400)
RBC: 3.88 MIL/uL — ABNORMAL LOW (ref 4.22–5.81)
RDW: 16.5 % — ABNORMAL HIGH (ref 11.5–15.5)
WBC: 5.1 10*3/uL (ref 4.0–10.5)

## 2015-07-09 LAB — GLUCOSE, CAPILLARY
GLUCOSE-CAPILLARY: 183 mg/dL — AB (ref 65–99)
Glucose-Capillary: 138 mg/dL — ABNORMAL HIGH (ref 65–99)
Glucose-Capillary: 167 mg/dL — ABNORMAL HIGH (ref 65–99)
Glucose-Capillary: 162 mg/dL — ABNORMAL HIGH (ref 65–99)

## 2015-07-09 MED ORDER — INSULIN GLARGINE 100 UNIT/ML ~~LOC~~ SOLN
10.0000 [IU] | Freq: Once | SUBCUTANEOUS | Status: AC
  Administered 2015-07-09: 10 [IU] via SUBCUTANEOUS
  Filled 2015-07-09: qty 0.1

## 2015-07-09 MED ORDER — METHIMAZOLE 5 MG PO TABS
5.0000 mg | ORAL_TABLET | Freq: Two times a day (BID) | ORAL | Status: DC
  Administered 2015-07-09 – 2015-07-13 (×8): 5 mg via ORAL
  Filled 2015-07-09 (×11): qty 1

## 2015-07-09 NOTE — Progress Notes (Signed)
69 y.o. diabetic, AA, male, [place] [employer] veteran, followed in the past by [employer] system. He has a past medical history significant for CABG x 3 at the [place] in [place] in 2003. In July 2013 he had a BS duel chamber ICD placed at the [place] in [place]. In Dec 2015 he had a LCE in [place] and suffered a stroke with residual Rt hand weakness. He apparently moved here in Jan 2016. Admitted 07/07/15 for CHF.     Subjective: No cheat pain, no SOB  Objective: Vital signs in last 24 hours: Temp:  [97.7 F (36.5 C)-98 F (36.7 C)] 98 F (36.7 C) (08/11 0615) Pulse Rate:  [76-80] 80 (08/11 0615) Resp:  [22-24] 22 (08/11 0615) BP: (131-177)/(58-79) 131/58 mmHg (08/11 0615) SpO2:  [96 %-98 %] 96 % (08/11 0615) Weight:  [226 lb (102.513 kg)] 226 lb (102.513 kg) (08/11 0615) Weight change: -23 lb 9.6 oz (-10.705 kg) Last BM Date: 07/08/15 Intake/Output from previous day: -1368 08/10 0701 - 08/11 0700 In: 360 [P.O.:360] Out: 1965 [Urine:1950] Intake/Output this shift:    PE: General:Pleasant affect, NAD Skin:Warm and dry, brisk capillary refill HEENT:normocephalic, sclera clear, mucus membranes moist Heart:S1S2 RRR with 2/6 aortic murmur, no gallup, rub or click Lungs:clear without rales, rhonchi, or wheezes ZOX:WRUE, non tender, + BS, do not palpate liver spleen or masses Ext:no lower ext edema, 2+ pedal pulses, 2+ radial pulses Neuro:alert and oriented, X 3 MAE, follows commands, + facial symmetry  tele:  SR with PVCs,   Lab Results:  Recent Labs  07/08/15 0447 07/09/15 0435  WBC 4.9 5.1  HGB 10.6* 11.5*  HCT 33.9* 36.3*  PLT 125* 126*   BMET  Recent Labs  07/08/15 0447 07/09/15 0435  NA 138 138  K 4.5 4.4  CL 103 102  CO2 27 29  GLUCOSE 130* 140*  BUN 25* 30*  CREATININE 1.43* 1.35*  CALCIUM 9.2 9.3    Recent Labs  07/07/15 1321 07/07/15 1930  TROPONINI 0.06* 0.06*    Lab Results  Component Value Date   CHOL 99 07/08/2015   HDL 31* 07/08/2015   LDLCALC 58 07/08/2015   TRIG 49 07/08/2015   CHOLHDL 3.2 07/08/2015   Lab Results  Component Value Date   HGBA1C 6.6* 07/07/2015     Lab Results  Component Value Date   TSH 0.037* 07/07/2015    Hepatic Function Panel  Recent Labs  07/07/15 1321 07/08/15 0447  PROT 8.2* 7.1  ALBUMIN 3.6 3.2*  AST 36 31  ALT 27 21  ALKPHOS 103 90  BILITOT 0.7 0.5  BILIDIR 0.1  --   IBILI 0.6  --     Recent Labs  07/08/15 0447  CHOL 99   No results for input(s): PROTIME in the last 72 hours.     Studies/Results: Venous dopplers: - No obvious evidence of deep vein or superficial thrombosis involving the right lower extremity and left lower extremity. An enlarged inguinal lymph node is noted bilaterally. Pulsatile waveforms suggest fluid overload. Mild interstitial fluid visualized in both calves  ECHO, 07/08/15: Study Conclusions - Left ventricle: The cavity size was mildly dilated. There was mild concentric hypertrophy. Systolic function was severely reduced. The estimated ejection fraction was in the range of 25% to 30%. Diffuse hypokinesis. There is akinesis of the basal-midinferolateral and inferior myocardium. Features are consistent with a pseudonormal left ventricular filling pattern, with concomitant abnormal relaxation and increased filling pressure (grade 2 diastolic dysfunction). Doppler parameters  are consistent with high ventricular filling pressure. - Aortic valve: Visually there appears to be severe aortic stenosis. The mean gradient of is probably underestimated due to low cardiac output from low EF. Recommend dobutamine stress echo if clinically indicated to assess further. Severe diffuse thickening and calcification. Valve mobility was restricted. There was mild regurgitation. Valve area (VTI): 1.28 cm^2. Valve area (Vmax): 1.11 cm^2. Valve area (Vmean): 1.11 cm^2. - Mitral valve: There was mild  regurgitation. - Left atrium: The atrium was severely dilated. - Right ventricle: The cavity size was moderately dilated. Wall thickness was normal. - Tricuspid valve: There was mild-moderate regurgitation. - Pulmonary arteries: PA peak pressure: 60 mm Hg (S).  Impressions:  - The right ventricular systolic pressure was increased consistent with moderate pulmonary hypertension.    Dg Chest 2 View  07/07/2015   CLINICAL DATA:  Short of breath.  Anasarca.  Cough.  EXAM: CHEST  2 VIEW  COMPARISON:  05/17/2015  FINDINGS: Cardiac enlargement with vascular congestion which has progressed. Mild interstitial edema and small pleural effusions bilaterally.  AICD remains in good position.  IMPRESSION: Congestive heart failure with mild edema and small pleural effusions.   Electronically Signed   By: Marlan Palau M.D.   On: 07/07/2015 10:23    Medications: I have reviewed the patient's current medications. Scheduled Meds: . aspirin  81 mg Oral Daily  . atorvastatin  40 mg Oral Daily  . carvedilol  3.125 mg Oral BID WC  . clopidogrel  75 mg Oral Daily  . enoxaparin (LOVENOX) injection  40 mg Subcutaneous Q24H  . ferrous sulfate  325 mg Oral TID WC  . furosemide  40 mg Intravenous Q12H  . insulin aspart  0-15 Units Subcutaneous TID WC  . insulin glargine  20 Units Subcutaneous BID  . lisinopril  10 mg Oral Daily  . methimazole  5 mg Oral BID  . pantoprazole  40 mg Oral Daily  . senna-docusate  1 tablet Oral BID   Continuous Infusions:  PRN Meds:.  Assessment/Plan: Principal Problem:   Acute on chronic combined systolic and diastolic congestive heart failure Active Problems:   S/P CABG x 3- 2003 ([place])   Type 2 diabetes with nephropathy   CVA- DEC 2015 [facility] [place])   Essential hypertension   Dyslipidemia   Renal insufficiency-stage 3- presumably chronic   ICD in place -BS-(implanted July 2013 [place])   Low TSH level   ICM-EF 25% 2D 07/07/15   Diastolic dysfunction-grade 2    Pulmonary hyperinflation-moderate   PVD- LCE Dec 2015   Aortic stenosis, severe   Acute on Chronic systolic and diastolic HF-  Negative 5,638 since admit with wt down 23 lbs since admit, echo with EF 25-30% and LVH and G2DD.  On lasix 40 mg BID.  Previous EF 30-35% from records.    Cardiomyopathy- EF 25030%, on ACE , coreg, and with BS ICD. Interrogated 3 episodes of NSVT on interrogation.   Aortic Stenosis- severe- ? dobutamine stress echo see report Pt has never been told he has valve problem, I see no notes from old records about AS.   Mod. pulmonary HTN  CKD- 3  Hyperthyroid with TSH 0.037 now on methimazole   Records requested from previous hospitals.   On statin for dyslipidemia.   HTN, BP somewhat labile 177/63 to 131/58    LOS: 2 days   Time spent with pt. :20 minutes. [facility] R  Nurse Practitioner Certified Pager [phone] or after 5pm and on weekends  call [phone] 07/09/2015, 10:09 AM   Agree with note written by Nada Boozer RNP  Feeling better. Less SOB. Excellent diuresis!!!! 2D shows severe LV dysfunction with question low output severe AS. Exam benign. Soft outflow murmur . Lungs clear. No periph edema. Plan R/L heart cath tomorrow to further eval AS and grafts.  Nanetta Batty 07/09/2015 11:26 AM

## 2015-07-09 NOTE — Care Management Important Message (Signed)
Important Message  Patient Details  Name: Daveyon Kitchings MRN: [id number] Date of Birth: 1946-03-31   Medicare Important Message Given:  [facility] notification given    Haskell Flirt 07/09/2015, 1:04 PMImportant Message  Patient Details  Name: Dietrick Barris MRN: [id number] Date of Birth: January 25, 1946   Medicare Important Message Given:  Yes-second notification given    Haskell Flirt 07/09/2015, 1:04 PM

## 2015-07-09 NOTE — Progress Notes (Signed)
Pt refused cpap tonight.  Pt was advised that RT is available all night should he change his mind.  RN aware. 

## 2015-07-09 NOTE — Progress Notes (Signed)
[facility] PROGRESS NOTE  Eric Mcguire GNF:[id number] DOB: 08/31/1946 DOA: 07/07/2015  [facility]  Brief HPI: 69 year old African-American male with a past medical history of diabetes, coronary artery disease status post CABG, ICD placement, history of stroke resented with complaints of shortness of breath. Found to have lower extremity edema and pulmonary edema. He was hospitalized for further management.  Past medical history:  Past Medical History  Diagnosis Date  . Stroke Dec 2015    Lt brain, [place]  . Hypertension   . Type 2 diabetes mellitus with renal manifestations   . Coronary artery disease   . ICD (implantable cardioverter-defibrillator), dual, in situ July 2013    BS implanted in [facility]  . S/P CABG x 3 2002    [facility] [place]  . PAF (paroxysmal atrial fibrillation)     not anticoagulated -hx GI bleed  . Ischemic cardiomyopathy Aug 2016    EF 25%     Consultants: Cardiology  Procedures:  2-D echocardiogram Study Conclusions - Left ventricle: The cavity size was mildly dilated. There wasmild concentric hypertrophy. Systolic function was severelyreduced. The estimated ejection fraction was in the range of 25%to 30%. Diffuse hypokinesis. There is akinesis of thebasal-midinferolateral and inferior myocardium. Features areconsistent with a pseudonormal left ventricular filling pattern,with concomitant abnormal relaxation and increased fillingpressure (grade 2 diastolic dysfunction). Doppler parameters areconsistent with high ventricular filling pressure. - Aortic valve: Visually there appears to be severe aorticstenosis. The mean gradient of is probably underestimateddue to low cardiac output from low EF. Recommend dobutaminestress echo if clinically indicated to assess further. Severediffuse thickening and calcification. Valve mobility wasrestricted. There was mild regurgitation. Valve area (VTI): 1.28cm^2. Valve area (Vmax): 1.11 cm^2. Valve area  (Vmean): 1.11cm^2. - Mitral valve: There was mild regurgitation. - Left atrium: The atrium was severely dilated. - Right ventricle: The cavity size was moderately dilated. Wallthickness was normal. - Tricuspid valve: There was mild-moderate regurgitation. - Pulmonary arteries: PA peak pressure: 60 mm Hg (S).   Lower extremity venous Dopplers No evidence of DVT, superficial thrombosis, or Baker's cyst.  Antibiotics: None  Subjective: Patient feels better. Still feels tired. Shortness of breath is improved, but breathing is not back to his usual. Denies any chest pain.  Objective: Vital Signs  Filed Vitals:   07/08/15 1217 07/08/15 1352 07/08/15 2149 07/09/15 0615  BP:  141/79 177/63 131/58  Pulse:  76 80 80  Temp:  97.7 F (36.5 C) 97.7 F (36.5 C) 98 F (36.7 C)  TempSrc:  Oral Oral Oral  Resp:  24 22 22   Height:      Weight:    102.513 kg (226 lb)  SpO2: 96% 97% 98% 96%    Intake/Output Summary (Last 24 hours) at 07/09/15 0906 Last data filed at 07/09/15 0523  Gross per 24 hour  Intake    360 ml  Output   1965 ml  Net  -1605 ml   Filed Weights   07/07/15 1237 07/08/15 0511 07/09/15 0615  Weight: 113.218 kg (249 lb 9.6 oz) 108.863 kg (240 lb) 102.513 kg (226 lb)    General appearance: alert, cooperative and no distress Resp: Crackles bilateral bases. Minimal end expiratory wheezing bilaterally. Cardio: regular rate and rhythm, S1, S2 normal, no murmur, click, rub or gallop GI: soft, non-tender; bowel sounds normal; no masses,  no organomegaly Extremities: Improving pitting edema bilateral lower extremities. Neurologic: No focal deficits  Lab Results:  Basic Metabolic Panel:  Recent Labs Lab 07/07/15 1023 07/07/15  1321 07/08/15 0447 07/09/15 0435  NA 139  --  138 138  K 5.1  --  4.5 4.4  CL 107  --  103 102  CO2 28  --  27 29  GLUCOSE 115*  --  130* 140*  BUN 23*  --  25* 30*  CREATININE 1.18 1.22 1.43* 1.35*  CALCIUM 9.1  --  9.2 9.3  MG  --    --  1.9  --    Liver Function Tests:  Recent Labs Lab 07/07/15 1321 07/08/15 0447  AST 36 31  ALT 27 21  ALKPHOS 103 90  BILITOT 0.7 0.5  PROT 8.2* 7.1  ALBUMIN 3.6 3.2*   CBC:  Recent Labs Lab 07/07/15 1321 07/08/15 0447 07/09/15 0435  WBC 6.1 4.9 5.1  HGB 11.0* 10.6* 11.5*  HCT 36.0* 33.9* 36.3*  MCV 94.7 94.4 93.6  PLT 137* 125* 126*   Cardiac Enzymes:  Recent Labs Lab 07/07/15 1321 07/07/15 1930  TROPONINI 0.06* 0.06*   BNP (last 3 results)  Recent Labs  07/07/15 1011 07/08/15 0447  BNP 670.5* 732.5*    CBG:  Recent Labs Lab 07/08/15 0746 07/08/15 1148 07/08/15 1633 07/08/15 2147 07/09/15 0738  GLUCAP 153* 142* 150* 122* 138*    No results found for this or any previous visit (from the past 240 hour(s)).    Studies/Results: Dg Chest 2 View  07/07/2015   CLINICAL DATA:  Short of breath.  Anasarca.  Cough.  EXAM: CHEST  2 VIEW  COMPARISON:  05/17/2015  FINDINGS: Cardiac enlargement with vascular congestion which has progressed. Mild interstitial edema and small pleural effusions bilaterally.  AICD remains in good position.  IMPRESSION: Congestive heart failure with mild edema and small pleural effusions.   Electronically Signed   By: Marlan Palau M.D.   On: 07/07/2015 10:23    Medications:  Scheduled: . aspirin  81 mg Oral Daily  . atorvastatin  40 mg Oral Daily  . carvedilol  3.125 mg Oral BID WC  . clopidogrel  75 mg Oral Daily  . enoxaparin (LOVENOX) injection  40 mg Subcutaneous Q24H  . ferrous sulfate  325 mg Oral TID WC  . furosemide  40 mg Intravenous Q12H  . insulin aspart  0-15 Units Subcutaneous TID WC  . insulin glargine  20 Units Subcutaneous BID  . lisinopril  10 mg Oral Daily  . methimazole  5 mg Oral BID  . pantoprazole  40 mg Oral Daily  . senna-docusate  1 tablet Oral BID   Continuous:  PRN:  Assessment/Plan:  Principal Problem:   Acute on chronic combined systolic and diastolic congestive heart failure Active  Problems:   S/[place] CABG [place] 3- 2003 ([place])   Type 2 diabetes with nephropathy   CVA- DEC 2015 [facility] [place])   Essential hypertension   Dyslipidemia   Renal insufficiency-stage 3- presumably chronic   ICD in place -BS-(implanted July 2013 [place])   Low TSH level   ICM-EF 25% 2D 07/07/15   Diastolic dysfunction-grade 2   Pulmonary hyperinflation-moderate   PVD- LCE Dec 2015    Acute systolic congestive heart failure Echocardiogram report as above. EF is 25-30%. Diffuse hypokinesis noted. Cardiology is following. Minimal elevation in troponin is noted. Asian denies any chest pain. Baseline EKG is abnormal. Continue with Lasix. Continue with ACE inhibitor. Strict ins and outs. Daily weights.  History of paroxysmal atrial fibrillation Apparently, has a pacemaker. He is currently not on anticoagulation. Apparently was taken off of it due  to questionable bleeding/requiring blood transfusion. Cardiology to address further.  Possible severe aortic stenosis This was suggested on the echocardiogram. Cardiology to address further.  History of diabetes mellitus type 2 on insulin Monitor CBGs. Continue Lantus and sliding scale coverage. HbA1c is 6.6.  History of coronary artery disease, status post CABG As noted above. He does have minimal elevation in troponin. Cardiology to address further. Continue antiplatelets agents. Continue beta blocker and statin. LDL is 58.  Essential hypertension Monitor BP closely.   History of sleep apnea CPAP  Hyperthyroidism  Free T4 is 1.28 which is higher than upper range of normal. TSH is low. T3 is pending. No goiter on examination. He denies any symptoms of hyperthyroidism. However, he does have cardiomyopathy. We will initiate methimazole. Will need thyroid function tests to be rechecked in 2-3 weeks. He did not know if he has been on amiodarone in the past.   DVT Prophylaxis: Lovenox    Code Status: Full code  Family Communication: Discussed with the  patient  Disposition Plan: Continue current management.    LOS: 2 days   [facility]  [facility] Pager [phone] 07/09/2015, 9:06 AM  If 7PM-7AM, please contact night-coverage at [email or web address], password [facility]

## 2015-07-10 ENCOUNTER — Encounter (HOSPITAL_COMMUNITY)
Admission: EM | Disposition: A | Discharge status: Home / Self Care | Payer: Self-pay | Source: Home / Self Care | Attending: Internal Medicine

## 2015-07-10 DIAGNOSIS — N183 Chronic kidney disease, stage 3 (moderate): Secondary | ICD-10-CM

## 2015-07-10 HISTORY — PX: PERIPHERAL VASCULAR CATHETERIZATION: SHX172C

## 2015-07-10 HISTORY — PX: CARDIAC CATHETERIZATION: SHX172

## 2015-07-10 LAB — BASIC METABOLIC PANEL
Anion gap: 7 (ref 5–15)
BUN: 30 mg/dL — AB (ref 6–20)
CO2: 31 mmol/L (ref 22–32)
Calcium: 9.4 mg/dL (ref 8.9–10.3)
Chloride: 101 mmol/L (ref 101–111)
Creatinine, Ser: 1.24 mg/dL (ref 0.61–1.24)
GFR calc Af Amer: >60 mL/min (ref >60)
GFR calc non Af Amer: 57 mL/min — ABNORMAL LOW (ref >60)
GLUCOSE: 120 mg/dL — AB (ref 65–99)
POTASSIUM: 4.2 mmol/L (ref 3.5–5.1)
SODIUM: 139 mmol/L (ref 135–145)

## 2015-07-10 LAB — POCT I-STAT 3, VENOUS BLOOD GAS (G3P V)
ACID-BASE EXCESS: 5 mmol/L — AB (ref 0.0–2.0)
Bicarbonate: 31.8 mEq/L — ABNORMAL HIGH (ref 20.0–24.0)
O2 SAT: 67 %
TCO2: 33 mmol/L (ref 0–100)
pCO2, Ven: 55.1 mmHg — ABNORMAL HIGH (ref 45.0–50.0)
pH, Ven: 7.37 — ABNORMAL HIGH (ref 7.250–7.300)
pO2, Ven: 37 mmHg (ref 30.0–45.0)

## 2015-07-10 LAB — CBC
HCT: 40.4 % (ref 39.0–52.0)
HEMOGLOBIN: 12.7 g/dL — AB (ref 13.0–17.0)
MCH: 29.3 pg (ref 26.0–34.0)
MCHC: 31.4 g/dL (ref 30.0–36.0)
MCV: 93.1 fL (ref 78.0–100.0)
Platelets: 141 10*3/uL — ABNORMAL LOW (ref 150–400)
RBC: 4.34 MIL/uL (ref 4.22–5.81)
RDW: 16.2 % — ABNORMAL HIGH (ref 11.5–15.5)
WBC: 4.2 10*3/uL (ref 4.0–10.5)

## 2015-07-10 LAB — POCT I-STAT 3, ART BLOOD GAS (G3+)
Acid-Base Excess: 3 mmol/L — ABNORMAL HIGH (ref 0.0–2.0)
BICARBONATE: 29.1 meq/L — AB (ref 20.0–24.0)
O2 Saturation: 93 %
PCO2 ART: 48.3 mmHg — AB (ref 35.0–45.0)
TCO2: 31 mmol/L (ref 0–100)
pH, Arterial: 7.387 (ref 7.350–7.450)
pO2, Arterial: 70 mmHg — ABNORMAL LOW (ref 80.0–100.0)

## 2015-07-10 LAB — GLUCOSE, CAPILLARY
GLUCOSE-CAPILLARY: 123 mg/dL — AB (ref 65–99)
GLUCOSE-CAPILLARY: 92 mg/dL (ref 65–99)
GLUCOSE-CAPILLARY: 196 mg/dL — AB (ref 65–99)
Glucose-Capillary: 110 mg/dL — ABNORMAL HIGH (ref 65–99)

## 2015-07-10 LAB — T3: T3 TOTAL: 86 ng/dL (ref 71–180)

## 2015-07-10 LAB — PROTIME-INR
INR: 1.28 (ref 0.00–1.49)
Prothrombin Time: 16.2 seconds — ABNORMAL HIGH (ref 11.6–15.2)

## 2015-07-10 LAB — T3, FREE: T3, Free: 2.4 pg/mL (ref 2.0–4.4)

## 2015-07-10 LAB — PLATELET INHIBITION P2Y12: PLATELET FUNCTION P2Y12: 168 [PRU] — AB (ref 194–418)

## 2015-07-10 SURGERY — RIGHT/LEFT HEART CATH AND CORONARY ANGIOGRAPHY
Anesthesia: LOCAL

## 2015-07-10 MED ORDER — SODIUM CHLORIDE 0.9 % IJ SOLN: 3.0000 mL | INTRAMUSCULAR | Status: DC | PRN

## 2015-07-10 MED ORDER — ASPIRIN 81 MG PO CHEW
81.0000 mg | CHEWABLE_TABLET | ORAL | Status: DC
  Filled 2015-07-10: qty 1

## 2015-07-10 MED ORDER — SODIUM CHLORIDE 0.9 % IV SOLN: INTRAVENOUS | Status: AC

## 2015-07-10 MED ORDER — ENOXAPARIN SODIUM 40 MG/0.4ML ~~LOC~~ SOLN
40.0000 mg | SUBCUTANEOUS | Status: DC
  Administered 2015-07-11 – 2015-07-12 (×2): 40 mg via SUBCUTANEOUS
  Filled 2015-07-10 (×3): qty 0.4

## 2015-07-10 MED ORDER — LIDOCAINE HCL (PF) 1 % IJ SOLN
INTRAMUSCULAR | Status: AC
  Filled 2015-07-10: qty 30

## 2015-07-10 MED ORDER — INSULIN ASPART PROT & ASPART (70-30 MIX) 100 UNIT/ML ~~LOC~~ SUSP
50.0000 [IU] | Freq: Two times a day (BID) | SUBCUTANEOUS | Status: DC
  Filled 2015-07-10: qty 10

## 2015-07-10 MED ORDER — SODIUM CHLORIDE 0.9 % IJ SOLN
3.0000 mL | Freq: Two times a day (BID) | INTRAMUSCULAR | Status: DC
  Administered 2015-07-10: 3 mL via INTRAVENOUS

## 2015-07-10 MED ORDER — ONDANSETRON HCL 4 MG/2ML IJ SOLN: 4.0000 mg | Freq: Four times a day (QID) | INTRAMUSCULAR | Status: DC | PRN

## 2015-07-10 MED ORDER — HEPARIN (PORCINE) IN NACL 2-0.9 UNIT/ML-% IJ SOLN
INTRAMUSCULAR | Status: AC
  Filled 2015-07-10: qty 1000

## 2015-07-10 MED ORDER — ACETAMINOPHEN 325 MG PO TABS: 650.0000 mg | ORAL_TABLET | ORAL | Status: DC | PRN

## 2015-07-10 MED ORDER — SODIUM CHLORIDE 0.9 % IV SOLN: INTRAVENOUS | Status: DC

## 2015-07-10 MED ORDER — IOHEXOL 350 MG/ML SOLN
INTRAVENOUS | Status: DC | PRN
  Administered 2015-07-10: 95 mL via INTRAVENOUS

## 2015-07-10 MED ORDER — SODIUM CHLORIDE 0.9 % IJ SOLN
3.0000 mL | Freq: Two times a day (BID) | INTRAMUSCULAR | Status: DC
  Administered 2015-07-11 – 2015-07-12 (×4): 3 mL via INTRAVENOUS

## 2015-07-10 MED ORDER — SODIUM CHLORIDE 0.9 % IV SOLN: 250.0000 mL | INTRAVENOUS | Status: DC | PRN

## 2015-07-10 MED ORDER — LIDOCAINE HCL (PF) 1 % IJ SOLN
INTRAMUSCULAR | Status: DC | PRN
  Administered 2015-07-10: 16:00:00

## 2015-07-10 SURGICAL SUPPLY — 12 items
CATH INFINITI 4FR 145 PIGTAIL (CATHETERS) ×3 IMPLANT
CATH INFINITI 5 FR LCB (CATHETERS) ×3 IMPLANT
CATH INFINITI 5FR MULTPACK ANG (CATHETERS) ×3 IMPLANT
CATH SWAN GANZ 7F STRAIGHT (CATHETERS) ×3 IMPLANT
KIT HEART LEFT (KITS) ×3 IMPLANT
KIT HEART RIGHT NAMIC (KITS) ×3 IMPLANT
PACK CARDIAC CATHETERIZATION (CUSTOM PROCEDURE TRAY) ×3 IMPLANT
SHEATH PINNACLE 5F 10CM (SHEATH) ×3 IMPLANT
SHEATH PINNACLE 7F 10CM (SHEATH) ×3 IMPLANT
SYR MEDRAD MARK V 150ML (SYRINGE) ×3 IMPLANT
TRANSDUCER W/STOPCOCK (MISCELLANEOUS) ×6 IMPLANT
WIRE EMERALD 3MM-J .035X150CM (WIRE) ×3 IMPLANT

## 2015-07-10 NOTE — Progress Notes (Signed)
PT to cath lab room 6.

## 2015-07-10 NOTE — Progress Notes (Signed)
Pt in Cath Holding via Care link pre cath .Oriented x 4 and pain free.

## 2015-07-10 NOTE — H&P (View-Only) (Signed)
       Subjective: Lying flat in bed, no complaints   Objective: Vital signs in last 24 hours: Temp:  [97.7 F (36.5 C)-98.1 F (36.7 C)] 97.9 F (36.6 C) (08/12 0527) Pulse Rate:  [77-79] 79 (08/12 0527) Resp:  [20] 20 (08/12 0527) BP: (130-148)/(63-68) 148/64 mmHg (08/12 0527) SpO2:  [92 %-96 %] 94 % (08/12 0527) Weight:  [217 lb (98.431 kg)] 217 lb (98.431 kg) (08/12 0527) Weight change: -9 lb (-4.082 kg) Last BM Date: 07/08/15 Intake/Output from previous day: since admit -4918,  Yesterday + 720 wt now down to 217 drop from 249 on admit  08/11 0701 - 08/12 0700 In: 720 [P.O.:720] Out: -  Intake/Output this shift:    PE: General:Pleasant affect, NAD Skin:Warm and dry, brisk capillary refill HEENT:normocephalic, sclera clear, mucus membranes moist Heart:S1S2 RRR with 2/6 aortic murmur, no gallup, rub or click Lungs:clear without rales, rhonchi, or wheezes Abd:soft, non tender, + BS, do not palpate liver spleen or masses Ext:no lower ext edema, 2+ pedal pulses, 2+ radial pulses Neuro:alert and oriented X 3, MAE, follows commands, + facial symmetry Tele:  SR some A pacing, occ PVCs.    Lab Results:  Recent Labs  07/09/15 0435 07/10/15 0355  WBC 5.1 4.2  HGB 11.5* 12.7*  HCT 36.3* 40.4  PLT 126* 141*   BMET  Recent Labs  07/09/15 0435 07/10/15 0355  NA 138 139  K 4.4 4.2  CL 102 101  CO2 29 31  GLUCOSE 140* 120*  BUN 30* 30*  CREATININE 1.35* 1.24  CALCIUM 9.3 9.4    Recent Labs  07/07/15 1321 07/07/15 1930  TROPONINI 0.06* 0.06*    Lab Results  Component Value Date   CHOL 99 07/08/2015   HDL 31* 07/08/2015   LDLCALC 58 07/08/2015   TRIG 49 07/08/2015   CHOLHDL 3.2 07/08/2015   Lab Results  Component Value Date   HGBA1C 6.6* 07/07/2015     Lab Results  Component Value Date   TSH 0.037* 07/07/2015    Hepatic Function Panel  Recent Labs  07/07/15 1321 07/08/15 0447  PROT 8.2* 7.1  ALBUMIN 3.6 3.2*  AST 36 31  ALT 27 21   ALKPHOS 103 90  BILITOT 0.7 0.5  BILIDIR 0.1  --   IBILI 0.6  --     Recent Labs  07/08/15 0447  CHOL 99   No results for input(s): PROTIME in the last 72 hours.     Studies/Results: ECHO: Study Conclusions  - Left ventricle: The cavity size was mildly dilated. There was mild concentric hypertrophy. Systolic function was severely reduced. The estimated ejection fraction was in the range of 25% to 30%. Diffuse hypokinesis. There is akinesis of the basal-midinferolateral and inferior myocardium. Features are consistent with a pseudonormal left ventricular filling pattern, with concomitant abnormal relaxation and increased filling pressure (grade 2 diastolic dysfunction). Doppler parameters are consistent with high ventricular filling pressure. - Aortic valve: Visually there appears to be severe aortic stenosis. The mean gradient of 13mmHg is probably underestimated due to low cardiac output from low EF. Recommend dobutamine stress echo if clinically indicated to assess further. Severe diffuse thickening and calcification. Valve mobility was restricted. There was mild regurgitation. Valve area (VTI): 1.28 cm^2. Valve area (Vmax): 1.11 cm^2. Valve area (Vmean): 1.11 cm^2. - Mitral valve: There was mild regurgitation. - Left atrium: The atrium was severely dilated. - Right ventricle: The cavity size was moderately dilated. Wall thickness was normal. - Tricuspid   valve: There was mild-moderate regurgitation. - Pulmonary arteries: PA peak pressure: 60 mm Hg (S).  Impressions:  - The right ventricular systolic pressure was increased consistent with moderate pulmonary hypertension.   Medications: I have reviewed the patient's current medications. Scheduled Meds: . aspirin  81 mg Oral Daily  . [START ON 07/11/2015] aspirin  81 mg Oral Pre-Cath  . atorvastatin  40 mg Oral Daily  . carvedilol  3.125 mg Oral BID WC  . clopidogrel  75 mg  Oral Daily  . enoxaparin (LOVENOX) injection  40 mg Subcutaneous Q24H  . ferrous sulfate  325 mg Oral TID WC  . insulin aspart  0-15 Units Subcutaneous TID WC  . insulin glargine  20 Units Subcutaneous BID  . methimazole  5 mg Oral BID  . pantoprazole  40 mg Oral Daily  . senna-docusate  1 tablet Oral BID  . sodium chloride  3 mL Intravenous Q12H   Continuous Infusions: . [START ON 07/11/2015] sodium chloride     PRN Meds:.sodium chloride, sodium chloride   Assessment/Plan: Principal Problem:   Acute on chronic combined systolic and diastolic congestive heart failure Active Problems:   S/P CAB[place] x 3- 2003 ([place])   Type 2 diabetes with nephropathy   CVA- DEC 2015 ([place])   Essential hypertension   Dyslipidemia   Renal insufficiency-stage 3- presumably chronic   ICD in place -BS-(implanted July 2013 [place])   Low TSH level   ICM-EF 25% 2D 07/07/15   Diastolic dysfunction-grade 2   Pulmonary hyperinflation-moderate   PVD- LCE Dec 2015   Aortic stenosis, severe   Hyperthyroidism  Acute on chronic systolic and diastolic HF- I held lasix and ACE today for cardiac cath.  Wt is down 32 pounds since admit. Now with new drop in EF to 25-30% from 30-35%  Cardiomyopathy- EF 25-30%, on ACE , coreg, and with BS ICD. Interrogated 3 episodes of NSVT on interrogation.   Aortic Stenosis- severe- for rt and Lt heart cath today Pt has never been told he has valve problem, I see no notes from old records about AS.   Mod. pulmonary HTN  CKD- 3 cr today 1.24 with GFR >60  Hyperthyroid with TSH 0.037 now on methimazole   On statin for dyslipidemia.   HTN, BP stable 142/68   The patient understands that risks included but are not limited to stroke (1 in 1000), death (1 in 1000), kidney failure [usually temporary] (1 in 500), bleeding (1 in 200), allergic reaction [possibly serious] (1 in 200).  Discussed wit both pt and his niece POA Celest (Phillis Depace)  at phone [phone]  -I have asked her to be at the hospital for procedure.       LOS: 3 days   Time spent with pt. :15 minutes. INGOLD,LAURA R  Nurse Practitioner Certified Pager [phone] or after 5pm and on weekends call [phone] 07/10/2015, 10:15 AM   Agree with note written by Laura Ingold RNP  Feels better. For R/L heart cath today.Scr better today.   Aliya Sol 07/10/2015 12:22 PM   

## 2015-07-10 NOTE — Progress Notes (Signed)
Subjective: Lying flat in bed, no complaints   Objective: Vital signs in last 24 hours: Temp:  [97.7 F (36.5 C)-98.1 F (36.7 C)] 97.9 F (36.6 C) (08/12 0527) Pulse Rate:  [77-79] 79 (08/12 0527) Resp:  [20] 20 (08/12 0527) BP: (130-148)/(63-68) 148/64 mmHg (08/12 0527) SpO2:  [92 %-96 %] 94 % (08/12 0527) Weight:  [217 lb (98.431 kg)] 217 lb (98.431 kg) (08/12 0527) Weight change: -9 lb (-4.082 kg) Last BM Date: 07/08/15 Intake/Output from previous day: since admit -4918,  Yesterday + 720 wt now down to 217 drop from 249 on admit  08/11 0701 - 08/12 0700 In: 720 [P.O.:720] Out: -  Intake/Output this shift:    PE: General:Pleasant affect, NAD Skin:Warm and dry, brisk capillary refill HEENT:normocephalic, sclera clear, mucus membranes moist Heart:S1S2 RRR with 2/6 aortic murmur, no gallup, rub or click Lungs:clear without rales, rhonchi, or wheezes WUJ:WJXB, non tender, + BS, do not palpate liver spleen or masses Ext:no lower ext edema, 2+ pedal pulses, 2+ radial pulses Neuro:alert and oriented X 3, MAE, follows commands, + facial symmetry Tele:  SR some A pacing, occ PVCs.    Lab Results:  Recent Labs  07/09/15 0435 07/10/15 0355  WBC 5.1 4.2  HGB 11.5* 12.7*  HCT 36.3* 40.4  PLT 126* 141*   BMET  Recent Labs  07/09/15 0435 07/10/15 0355  NA 138 139  K 4.4 4.2  CL 102 101  CO2 29 31  GLUCOSE 140* 120*  BUN 30* 30*  CREATININE 1.35* 1.24  CALCIUM 9.3 9.4    Recent Labs  07/07/15 1321 07/07/15 1930  TROPONINI 0.06* 0.06*    Lab Results  Component Value Date   CHOL 99 07/08/2015   HDL 31* 07/08/2015   LDLCALC 58 07/08/2015   TRIG 49 07/08/2015   CHOLHDL 3.2 07/08/2015   Lab Results  Component Value Date   HGBA1C 6.6* 07/07/2015     Lab Results  Component Value Date   TSH 0.037* 07/07/2015    Hepatic Function Panel  Recent Labs  07/07/15 1321 07/08/15 0447  PROT 8.2* 7.1  ALBUMIN 3.6 3.2*  AST 36 31  ALT 27 21   ALKPHOS 103 90  BILITOT 0.7 0.5  BILIDIR 0.1  --   IBILI 0.6  --     Recent Labs  07/08/15 0447  CHOL 99   No results for input(s): PROTIME in the last 72 hours.     Studies/Results: ECHO: Study Conclusions  - Left ventricle: The cavity size was mildly dilated. There was mild concentric hypertrophy. Systolic function was severely reduced. The estimated ejection fraction was in the range of 25% to 30%. Diffuse hypokinesis. There is akinesis of the basal-midinferolateral and inferior myocardium. Features are consistent with a pseudonormal left ventricular filling pattern, with concomitant abnormal relaxation and increased filling pressure (grade 2 diastolic dysfunction). Doppler parameters are consistent with high ventricular filling pressure. - Aortic valve: Visually there appears to be severe aortic stenosis. The mean gradient of is probably underestimated due to low cardiac output from low EF. Recommend dobutamine stress echo if clinically indicated to assess further. Severe diffuse thickening and calcification. Valve mobility was restricted. There was mild regurgitation. Valve area (VTI): 1.28 cm^2. Valve area (Vmax): 1.11 cm^2. Valve area (Vmean): 1.11 cm^2. - Mitral valve: There was mild regurgitation. - Left atrium: The atrium was severely dilated. - Right ventricle: The cavity size was moderately dilated. Wall thickness was normal. - Tricuspid  valve: There was mild-moderate regurgitation. - Pulmonary arteries: PA peak pressure: 60 mm Hg (S).  Impressions:  - The right ventricular systolic pressure was increased consistent with moderate pulmonary hypertension.   Medications: I have reviewed the patient's current medications. Scheduled Meds: . aspirin  81 mg Oral Daily  . [START ON 07/11/2015] aspirin  81 mg Oral Pre-Cath  . atorvastatin  40 mg Oral Daily  . carvedilol  3.125 mg Oral BID WC  . clopidogrel  75 mg  Oral Daily  . enoxaparin (LOVENOX) injection  40 mg Subcutaneous Q24H  . ferrous sulfate  325 mg Oral TID WC  . insulin aspart  0-15 Units Subcutaneous TID WC  . insulin glargine  20 Units Subcutaneous BID  . methimazole  5 mg Oral BID  . pantoprazole  40 mg Oral Daily  . senna-docusate  1 tablet Oral BID  . sodium chloride  3 mL Intravenous Q12H   Continuous Infusions: . [START ON 07/11/2015] sodium chloride     PRN Meds:.sodium chloride, sodium chloride   Assessment/Plan: Principal Problem:   Acute on chronic combined systolic and diastolic congestive heart failure Active Problems:   S/P CABG x 3- 2003 ([place])   Type 2 diabetes with nephropathy   CVA- DEC 2015 [facility] [place])   Essential hypertension   Dyslipidemia   Renal insufficiency-stage 3- presumably chronic   ICD in place -BS-(implanted July 2013 [place])   Low TSH level   ICM-EF 25% 2D 07/07/15   Diastolic dysfunction-grade 2   Pulmonary hyperinflation-moderate   PVD- LCE Dec 2015   Aortic stenosis, severe   Hyperthyroidism  Acute on chronic systolic and diastolic HF- I held lasix and ACE today for cardiac cath.  Wt is down 32 pounds since admit. Now with new drop in EF to 25-30% from 30-35%  Cardiomyopathy- EF 25-30%, on ACE , coreg, and with BS ICD. Interrogated 3 episodes of NSVT on interrogation.   Aortic Stenosis- severe- for rt and Lt heart cath today Pt has never been told he has valve problem, I see no notes from old records about AS.   Mod. pulmonary HTN  CKD- 3 cr today 1.24 with GFR >60  Hyperthyroid with TSH 0.037 now on methimazole   On statin for dyslipidemia.   HTN, BP stable 142/68   The patient understands that risks included but are not limited to stroke (1 in 1000), death (1 in 1000), kidney failure [usually temporary] (1 in 500), bleeding (1 in 200), allergic reaction [possibly serious] (1 in 200).  Discussed wit both pt and his niece POA Celest Chrles Selley)  at phone [phone]  -I have asked her to be at the hospital for procedure.       LOS: 3 days   Time spent with pt. :15 minutes. [facility] R  Nurse Practitioner Certified Pager [phone] or after 5pm and on weekends call [phone] 07/10/2015, 10:15 AM   Agree with note written by Nada Boozer RNP  Feels better. For R/L heart cath today.Scr better today.   Nanetta Batty 07/10/2015 12:22 PM

## 2015-07-10 NOTE — Progress Notes (Addendum)
7 fr sheath aspirated and removed from rfv. Manual pressure applied for 5 minutes. 42fr arterial sheath aspirated and removed from rfa, manual pressure applied to both sites for 20 minutes.  No s+s of hematoma, tegaderm dressing applied. Bedrest instructions given. Bilateral dp pulses palpable.Groin level 0   Bedrest begins at 17:05:00

## 2015-07-10 NOTE — Interval H&P Note (Signed)
Cath Lab Visit (complete for each Cath Lab visit)  Clinical Evaluation Leading to the Procedure:   ACS: Yes.    Non-ACS:    Anginal Classification: CCS IV  Anti-ischemic medical therapy: Minimal Therapy (1 class of medications)  Non-Invasive Test Results: High-risk stress test findings: cardiac mortality >3%/year  Prior CABG: Previous CABG  TIMI SCORE  Patient Information:  TIMI Score is 3  UA/NSTEMI and intermediate-risk features (e.g., TIMI score 3?4) for short-term risk of death or nonfatal MI  Revascularization of the presumed culprit artery   A (9)  Indication: 10; Score: 9     History and Physical Interval Note:  07/10/2015 2:57 PM  Eric Mcguire  has presented today for surgery, with the diagnosis of cp  The various methods of treatment have been discussed with the patient and family. After consideration of risks, benefits and other options for treatment, the patient has consented to  Procedure(s): Right/Left Heart Cath and Coronary Angiography (N/A) as a surgical intervention .  The patient's history has been reviewed, patient examined, no change in status, stable for surgery.  I have reviewed the patient's chart and labs.  Questions were answered to the patient's satisfaction.     Eric Mcguire S.

## 2015-07-10 NOTE — Progress Notes (Signed)
[facility] PROGRESS NOTE  Eric Mcguire ZOX:[id number] DOB: 1946-10-27 DOA: 07/07/2015  [facility]  Brief HPI: 69 year old African-American male with a past medical history of diabetes, coronary artery disease status post CABG, ICD placement, history of stroke resented with complaints of shortness of breath. Found to have lower extremity edema and pulmonary edema. He was hospitalized for further management.  Past medical history:  Past Medical History  Diagnosis Date  . Stroke Dec 2015    Lt brain, [place]  . Hypertension   . Type 2 diabetes mellitus with renal manifestations   . Coronary artery disease   . 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 [facility] CABG x [facility] [place]  . PAF (paroxysmal atrial fibrillation)     not anticoagulated -hx GI bleed  . Ischemic cardiomyopathy Aug 2016    EF 25%   . Aortic stenosis, severe 07/09/2015    Consultants: Cardiology  Procedures:  2-D echocardiogram Study Conclusions - Left ventricle: The cavity size was mildly dilated. There wasmild concentric hypertrophy. Systolic function was severelyreduced. The estimated ejection fraction was in the range of 25%to 30%. Diffuse hypokinesis. There is akinesis of thebasal-midinferolateral and inferior myocardium. Features areconsistent with a pseudonormal left ventricular filling pattern,with concomitant abnormal relaxation and increased fillingpressure (grade 2 diastolic dysfunction). Doppler parameters areconsistent with high ventricular filling pressure. - Aortic valve: Visually there appears to be severe aorticstenosis. The mean gradient of is probably underestimateddue to low cardiac output from low EF. Recommend dobutaminestress echo if clinically indicated to assess further. Severediffuse thickening and calcification. Valve mobility wasrestricted. There was mild regurgitation. Valve area (VTI): 1.28cm^2. Valve area  (Vmax): 1.11 cm^2. Valve area (Vmean): 1.11cm^2. - Mitral valve: There was mild regurgitation. - Left atrium: The atrium was severely dilated. - Right ventricle: The cavity size was moderately dilated. Wallthickness was normal. - Tricuspid valve: There was mild-moderate regurgitation. - Pulmonary arteries: PA peak pressure: 60 mm Hg (S).   Lower extremity venous Dopplers No evidence of DVT, superficial thrombosis, or Baker's cyst.  Antibiotics: None  Subjective: Patient feels better. Shortness of breath is improved. Denies any chest pain. Ambulating to the bathroom and back.   Objective: Vital Signs  Filed Vitals:   07/09/15 0615 07/09/15 1450 07/09/15 2039 07/10/15 0527  BP: 131/58 130/63 142/68 148/64  Pulse: 80 79 77 79  Temp: 98 F (36.7 C) 97.7 F (36.5 C) 98.1 F (36.7 C) 97.9 F (36.6 C)  TempSrc: Oral Oral Oral Oral  Resp: 22 20 20 20   Height:      Weight: 102.513 kg (226 lb)   98.431 kg (217 lb)  SpO2: 96% 92% 96% 94%    Intake/Output Summary (Last 24 hours) at 07/10/15 0733 Last data filed at 07/09/15 1852  Gross per 24 hour  Intake    720 ml  Output      0 ml  Net    720 ml   Filed Weights   07/08/15 0511 07/09/15 0615 07/10/15 0527  Weight: 108.863 kg (240 lb) 102.513 kg (226 lb) 98.431 kg (217 lb)    General appearance: alert, cooperative and no distress Resp: Much improved air entry bilaterally with just a few crackles at the bases.  Cardio: regular rate and rhythm, S1, S2 normal, no murmur, click, rub or gallop GI: soft, non-tender; bowel sounds normal; no masses,  no organomegaly Extremities: Improving pitting edema bilateral lower extremities. Neurologic:  No focal deficits  Lab Results:  Basic Metabolic Panel:  Recent Labs Lab 07/07/15 1023 07/07/15 1321 07/08/15 0447 07/09/15 0435 07/10/15 0355  NA 139  --  138 138 139  K 5.1  --  4.5 4.4 4.2  CL 107  --  103 102 101  CO2 28  --  GLUCOSE 115*  --  130* 140* 120*  BUN  23*  --  25* 30* 30*  CREATININE 1.18 1.22 1.43* 1.35* 1.24  CALCIUM 9.1  --  9.2 9.3 9.4  MG  --   --  1.9  --   --    Liver Function Tests:  Recent Labs Lab 07/07/15 1321 07/08/15 0447  AST 36 31  ALT 27 21  ALKPHOS 103 90  BILITOT 0.7 0.5  PROT 8.2* 7.1  ALBUMIN 3.6 3.2*   CBC:  Recent Labs Lab 07/07/15 1321 07/08/15 0447 07/09/15 0435 07/10/15 0355  WBC 6.1 4.9 5.1 4.2  HGB 11.0* 10.6* 11.5* 12.7*  HCT 36.0* 33.9* 36.3* 40.4  MCV 94.7 94.4 93.6 93.1  PLT 137* 125* 126* 141*   Cardiac Enzymes:  Recent Labs Lab 07/07/15 1321 07/07/15 1930  TROPONINI 0.06* 0.06*   BNP (last 3 results)  Recent Labs  07/07/15 1011 07/08/15 0447  BNP 670.5* 732.5*    CBG:  Recent Labs Lab 07/08/15 2147 07/09/15 0738 07/09/15 1204 07/09/15 1555 07/09/15 2122  GLUCAP 122* 138* 183* 167* 162*    No results found for this or any previous visit (from the past 240 hour(s)).    Studies/Results: No results found.  Medications:  Scheduled: . aspirin  81 mg Oral Daily  . [START ON 07/11/2015] aspirin  81 mg Oral Pre-Cath  . atorvastatin  40 mg Oral Daily  . carvedilol  3.125 mg Oral BID WC  . clopidogrel  75 mg Oral Daily  . enoxaparin (LOVENOX) injection  40 mg Subcutaneous Q24H  . ferrous sulfate  325 mg Oral TID WC  . insulin aspart  0-15 Units Subcutaneous TID WC  . insulin glargine  20 Units Subcutaneous BID  . methimazole  5 mg Oral BID  . pantoprazole  40 mg Oral Daily  . senna-docusate  1 tablet Oral BID  . sodium chloride  3 mL Intravenous Q12H   Continuous: . [START ON 07/11/2015] sodium chloride     PRN:  Assessment/Plan:  Principal Problem:   Acute on chronic combined systolic and diastolic congestive heart failure Active Problems:   S/P CABG x 3- 2003 ([place])   Type 2 diabetes with nephropathy   CVA- DEC 2015 [facility] [place])   Essential hypertension   Dyslipidemia   Renal insufficiency-stage 3- presumably chronic   ICD in place  -BS-(implanted July 2013 [place])   Low TSH level   ICM-EF 25% 2D 07/07/15   Diastolic dysfunction-grade 2   Pulmonary hyperinflation-moderate   PVD- LCE Dec 2015   Aortic stenosis, severe   Hyperthyroidism    Acute systolic congestive heart failure Echocardiogram report as above. EF is 25-30%. Diffuse hypokinesis noted. Cardiology is following. Minimal elevation in troponin is noted. Patient denies any chest pain. Baseline EKG is abnormal. Continue with Lasix. Patient has diuresed well. It appears that he has lost about 10 kg during this hospitalization. Continue with ACE inhibitor. Strict ins and outs. Daily weights. Plan is for cardiac catheterization today.  History of paroxysmal atrial fibrillation Apparently, has a pacemaker. He is currently not on anticoagulation. Apparently was taken off of it  due to questionable bleeding/requiring blood transfusion. Cardiology to address further.  Possible severe aortic stenosis This was suggested on the echocardiogram. Cardiology to address further. Will be evaluated during cardiac catheterization today.  History of diabetes mellitus type 2 on insulin Monitor CBGs. Continue Lantus and sliding scale coverage. HbA1c is 6.6.  History of coronary artery disease, status post CABG As noted above. He does have minimal elevation in troponin. Cardiology to address further. Continue antiplatelets agents. Continue beta blocker and statin. LDL is 58.  Essential hypertension Monitor BP closely.   History of sleep apnea CPAP  Hyperthyroidism  Free T4 is 1.28 which is higher than upper range of normal. TSH is low. T3 is normal. No goiter on examination. He denies any symptoms of hyperthyroidism. However, he does have cardiomyopathy. Continue methimazole. Will need thyroid function tests to be rechecked in 2-3 weeks. He did not know if he has been on amiodarone in the past. Will need outpatient follow-up with endocrinology. This can be done at the [place].  DVT  Prophylaxis: Lovenox    Code Status: Full code  Family Communication: Discussed with the patient  Disposition Plan: Continue current management.    LOS: 3 days   [facility]  [facility] Pager [phone] 07/10/2015, 7:33 AM  If 7PM-7AM, please contact night-coverage at [email or web address], password [facility]

## 2015-07-10 NOTE — Progress Notes (Signed)
Patient is refusing CPAP for tonight. RT made patient aware that if he changed his mind to call.  

## 2015-07-11 DIAGNOSIS — R0989 Other specified symptoms and signs involving the circulatory and respiratory systems: Secondary | ICD-10-CM

## 2015-07-11 DIAGNOSIS — I639 Cerebral infarction, unspecified: Secondary | ICD-10-CM

## 2015-07-11 LAB — BASIC METABOLIC PANEL
Anion gap: 6 (ref 5–15)
BUN: 23 mg/dL — ABNORMAL HIGH (ref 6–20)
CO2: 28 mmol/L (ref 22–32)
Calcium: 9 mg/dL (ref 8.9–10.3)
Chloride: 100 mmol/L — ABNORMAL LOW (ref 101–111)
Creatinine, Ser: 1.31 mg/dL — ABNORMAL HIGH (ref 0.61–1.24)
GFR calc Af Amer: >60 mL/min (ref >60)
GFR calc non Af Amer: 53 mL/min — ABNORMAL LOW (ref >60)
GLUCOSE: 101 mg/dL — AB (ref 65–99)
POTASSIUM: 4.3 mmol/L (ref 3.5–5.1)
Sodium: 134 mmol/L — ABNORMAL LOW (ref 135–145)

## 2015-07-11 LAB — GLUCOSE, CAPILLARY
Glucose-Capillary: 106 mg/dL — ABNORMAL HIGH (ref 65–99)
Glucose-Capillary: 165 mg/dL — ABNORMAL HIGH (ref 65–99)
Glucose-Capillary: 182 mg/dL — ABNORMAL HIGH (ref 65–99)
Glucose-Capillary: 130 mg/dL — ABNORMAL HIGH (ref 65–99)

## 2015-07-11 NOTE — Progress Notes (Signed)
Primary cardiologist: [facility]  Seen for followup: CAD, cardiomyopathy, aortic valve disease  Subjective:    No chest pain or shortness of breath.  Objective:   Temp:  [98.1 F (36.7 C)-98.4 F (36.9 C)] 98.2 F (36.8 C) (08/13 0517) Pulse Rate:  [0-81] 78 (08/13 0517) Resp:  [0-31] 18 (08/13 0517) BP: (130-166)/(56-82) 132/59 mmHg (08/13 0517) SpO2:  [0 %-100 %] 95 % (08/13 0517) Weight:  [215 lb 13.3 oz (97.9 kg)] 215 lb 13.3 oz (97.9 kg) (08/13 0517) Last BM Date: 07/09/15  Filed Weights   07/09/15 0615 07/10/15 0527 07/11/15 0517  Weight: 226 lb (102.513 kg) 217 lb (98.431 kg) 215 lb 13.3 oz (97.9 kg)    Intake/Output Summary (Last 24 hours) at 07/11/15 1048 Last data filed at 07/11/15 0852  Gross per 24 hour  Intake   1404 ml  Output   2425 ml  Net  -1021 ml    Telemetry: Sinus rhythm with PVCs.  Exam:  General: Obese male, no distress.  Lungs: Decreased breath sounds without crackles.  Cardiac: RRR with soft systolic murmur.  Abdomen: NABS.  Extremities: Improving edema. Cardiac catheterization site stable.  Lab Results:  Basic Metabolic Panel:  Recent Labs Lab 07/08/15 0447 07/09/15 0435 07/10/15 0355 07/11/15 0342  NA 138 138 139 134*  K 4.5 4.4 4.2 4.3  CL 103 102 101 100*  CO2 GLUCOSE 130* 140* 120* 101*  BUN 25* 30* 30* 23*  CREATININE 1.43* 1.35* 1.24 1.31*  CALCIUM 9.2 9.3 9.4 9.0  MG 1.9  --   --   --     Liver Function Tests:  Recent Labs Lab 07/07/15 1321 07/08/15 0447  AST 36 31  ALT 27 21  ALKPHOS 103 90  BILITOT 0.7 0.5  PROT 8.2* 7.1  ALBUMIN 3.6 3.2*    CBC:  Recent Labs Lab 07/08/15 0447 07/09/15 0435 07/10/15 0355  WBC 4.9 5.1 4.2  HGB 10.6* 11.5* 12.7*  HCT 33.9* 36.3* 40.4  MCV 94.4 93.6 93.1  PLT 125* 126* 141*    Cardiac Enzymes:  Recent Labs Lab 07/07/15 1321 07/07/15 1930  TROPONINI 0.06* 0.06*    Coagulation:  Recent Labs Lab 07/08/15 0447  07/10/15 0355  INR 1.29 1.28    Cardiac catheterization 07/10/2015: Conclusion     Patent LIMA to LAD. Patent SVG to OM. Patent stents in the proximal RCA with diffuse disease in this apparent nondominant vessel.  Aortic valve was easily crossed with a JR4 catheter, without a wire. There was only a minimal gradient at pullback.  Mild pulmonary artery hypertension.  Normal LVEDP.  Mid LAD-1 lesion, 25% stenosed.  Mid LAD-2 lesion, 80% stenosed.  Continue medical therapy for left ventricular dysfunction. It does not appear that further evaluation of the aortic valve is necessary.  Cardiology follow-up with Dr. Allyson Sabal.     Coronary Findings    Dominance: Left   Left Anterior Descending   . Mid LAD-1 lesion, 25% stenosed.   . Mid LAD-2 lesion, 80% stenosed.     Left Circumflex  There is moderate the vessel.   Suezanne Jacquet Cx lesion, 80% stenosed. The lesion is type C located at the major branch .   Marland Kitchen First Obtuse Marginal Branch   There is moderate in the vessel.     Right Coronary Artery  The vessel is small .   Marland Kitchen Ost RCA to Prox RCA lesion, 50% stenosed.   . Prox RCA  lesion, 10% stenosed. The lesion was previously treated with a stent (unknown type) .   Marland Kitchen Acute Marginal Branch   . Acute Mrg lesion, 90% stenosed.     Graft Angiography    LIMA Graft to Dist LAD  is moderate in size, and is anatomically normal.     Graft to 4th Mrg           Right Heart Pressures Hemodynamic findings consistent with mild pulmonary hypertension. LV EDP is normal.    Left Heart    Left Ventricle The left ventricle is enlarged.   Aortic Valve There is mild aortic valve stenosis. Were able to cross the aortic valve with just the JR4 catheter and no wire. There was only a minimal gradient at pullback.    Medications:   Scheduled Medications: . aspirin  81 mg Oral Daily  . atorvastatin  40 mg Oral Daily  . carvedilol  3.125 mg Oral BID WC  . clopidogrel  75 mg Oral  Daily  . enoxaparin (LOVENOX) injection  40 mg Subcutaneous Q24H  . ferrous sulfate  325 mg Oral TID WC  . insulin aspart  0-15 Units Subcutaneous TID WC  . insulin glargine  20 Units Subcutaneous BID  . methimazole  5 mg Oral BID  . pantoprazole  40 mg Oral Daily  . senna-docusate  1 tablet Oral BID  . sodium chloride  3 mL Intravenous Q12H     PRN Medications:  sodium chloride, acetaminophen, ondansetron (ZOFRAN) IV, sodium chloride   Assessment:   1. Acute on chronic combined heart failure, LVEF 25-30% with grade 2 diastolic dysfunction. Patient has had substantial diuresis during hospital stay, weight down approximately 30 pounds.  2. CAD status post CABG and prior PCI, bypass grafts and RCA stents patent at cardiac catheterization yesterday. Normal cardiac index, moderate to severe pulmonary hypertension, normal LVEDP.  3. Aortic valve easily crossed at cardiac catheterization with minimal gradient at pullback, suggests mild rather than severe aortic stenosis.  4. CKD stage 3, creatinine 1.3 up from 1.2. Currently ACE inhibitor and diuretic held with recent angiography.  5. Hyperlipidemia, on statin therapy.  6. Ischemic cardiomyopathy status post ICD placement.  7. Reported history of PAF, currently in sinus rhythm. Not anticoagulated with history of prior GI bleeding.   Plan/Discussion:    Continue cardiac current cardiac regimen, follow-up BMET in a.m. If renal function stable, will resume ACE inhibitor and diuretic. Baseline weight is uncertain, but with recently documented filling pressures, suspect close to discharge for further outpatient management.   Jonelle Sidle, M.D., F.A.C.C.

## 2015-07-11 NOTE — Progress Notes (Signed)
Patient refusing CPAP for tonight. 

## 2015-07-11 NOTE — Progress Notes (Signed)
[facility] PROGRESS NOTE  Eric Mcguire ZOX:[id number] DOB: 1946-11-17 DOA: 07/07/2015  [facility]  Brief HPI: 69 year old African-American male with a past medical history of diabetes, coronary artery disease status post CABG, ICD placement, history of stroke resented with complaints of shortness of breath. Found to have lower extremity edema and pulmonary edema. He was hospitalized for further management.  Past medical history:  Past Medical History  Diagnosis Date  . Stroke Dec 2015    Lt brain, [place]  . Hypertension   . Type 2 diabetes mellitus with renal manifestations   . Coronary artery disease   . 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 [facility] x [facility] [place]  . PAF (paroxysmal atrial fibrillation)     not anticoagulated -hx GI bleed  . Ischemic cardiomyopathy Aug 2016    EF 25%   . Aortic stenosis, severe 07/09/2015    Consultants: Cardiology  Procedures:  2-D echocardiogram Study Conclusions - Left ventricle: The cavity size was mildly dilated. There wasmild concentric hypertrophy. Systolic function was severelyreduced. The estimated ejection fraction was in the range of 25%to 30%. Diffuse hypokinesis. There is akinesis of thebasal-midinferolateral and inferior myocardium. Features areconsistent with a pseudonormal left ventricular filling pattern,with concomitant abnormal relaxation and increased fillingpressure (grade 2 diastolic dysfunction). Doppler parameters areconsistent with high ventricular filling pressure. - Aortic valve: Visually there appears to be severe aorticstenosis. The mean gradient of is probably underestimateddue to low cardiac output from low EF. Recommend dobutaminestress echo if clinically indicated to assess further. Severediffuse thickening and calcification. Valve mobility wasrestricted. There was mild regurgitation. Valve area (VTI): 1.28cm^2. Valve area  (Vmax): 1.11 cm^2. Valve area (Vmean): 1.11cm^2. - Mitral valve: There was mild regurgitation. - Left atrium: The atrium was severely dilated. - Right ventricle: The cavity size was moderately dilated. Wallthickness was normal. - Tricuspid valve: There was mild-moderate regurgitation. - Pulmonary arteries: PA peak pressure: 60 mm Hg (S).   Lower extremity venous Dopplers No evidence of DVT, superficial thrombosis, or Baker's cyst.  Antibiotics: None  Subjective: Patient feels better. Shortness of breath is improved. Denies any chest pain. Ambulating to the bathroom and back.   Objective: Vital Signs  Filed Vitals:   07/10/15 1817 07/10/15 1832 07/10/15 2046 07/11/15 0517  BP: 149/60 131/57 130/56 132/59  Pulse: 81 78 78 78  Temp:   98.4 F (36.9 C) 98.2 F (36.8 C)  TempSrc:   Oral Oral  Resp: Height:      Weight:    97.9 kg (215 lb 13.3 oz)  SpO2: 100% 97% 93% 95%    Intake/Output Summary (Last 24 hours) at 07/11/15 1330 Last data filed at 07/11/15 0852  Gross per 24 hour  Intake   1404 ml  Output   2425 ml  Net  -1021 ml   Filed Weights   07/09/15 0615 07/10/15 0527 07/11/15 0517  Weight: 102.513 kg (226 lb) 98.431 kg (217 lb) 97.9 kg (215 lb 13.3 oz)    General appearance: alert, cooperative and no distress Resp: Much improved air entry bilaterally with just a few crackles at the bases.  Cardio: regular rate and rhythm, S1, S2 normal, no murmur, click, rub or gallop GI: soft, non-tender; bowel sounds normal; no masses,  no organomegaly Extremities: Improving pitting edema bilateral lower extremities. Neurologic: No focal deficits  Lab Results:  Basic Metabolic Panel:  Recent Labs Lab 07/07/15 1023 07/07/15 1321 07/08/15 0447 07/09/15 0435 07/10/15 0355 07/11/15 0342  NA 139  --  138 138 139 134*  K 5.1  --  4.5 4.4 4.2 4.3  CL 107  --  103 102 101 100*  CO2 28  --  27 29 31 28   GLUCOSE 115*  --  130* 140* 120* 101*  BUN 23*  --   25* 30* 30* 23*  CREATININE 1.18 1.22 1.43* 1.35* 1.24 1.31*  CALCIUM 9.1  --  9.2 9.3 9.4 9.0  MG  --   --  1.9  --   --   --    Liver Function Tests:  Recent Labs Lab 07/07/15 1321 07/08/15 0447  AST 36 31  ALT 27 21  ALKPHOS 103 90  BILITOT 0.7 0.5  PROT 8.2* 7.1  ALBUMIN 3.6 3.2*   CBC:  Recent Labs Lab 07/07/15 1321 07/08/15 0447 07/09/15 0435 07/10/15 0355  WBC 6.1 4.9 5.1 4.2  HGB 11.0* 10.6* 11.5* 12.7*  HCT 36.0* 33.9* 36.3* 40.4  MCV 94.7 94.4 93.6 93.1  PLT 137* 125* 126* 141*   Cardiac Enzymes:  Recent Labs Lab 07/07/15 1321 07/07/15 1930  TROPONINI 0.06* 0.06*   BNP (last 3 results)  Recent Labs  07/07/15 1011 07/08/15 0447  BNP 670.5* 732.5*    CBG:  Recent Labs Lab 07/10/15 1150 07/10/15 1640 07/10/15 2100 07/11/15 0612 07/11/15 1109  GLUCAP 110* 92 196* 106* 165*    No results found for this or any previous visit (from the past 240 hour(s)).    Studies/Results: No results found.  Medications:  Scheduled: . aspirin  81 mg Oral Daily  . atorvastatin  40 mg Oral Daily  . carvedilol  3.125 mg Oral BID WC  . clopidogrel  75 mg Oral Daily  . enoxaparin (LOVENOX) injection  40 mg Subcutaneous Q24H  . ferrous sulfate  325 mg Oral TID WC  . insulin aspart  0-15 Units Subcutaneous TID WC  . insulin glargine  20 Units Subcutaneous BID  . methimazole  5 mg Oral BID  . pantoprazole  40 mg Oral Daily  . senna-docusate  1 tablet Oral BID  . sodium chloride  3 mL Intravenous Q12H   Continuous:   PRN:  Assessment/Plan:  Acute systolic congestive heart failure -Echocardiogram EF is 25-30%.  -Cardiology following -negative 5.7L, lasix and ACE held for Cath -cath with patent grafts -Resume diuretics  History of paroxysmal atrial fibrillation -not on anticoagulation, reportedly was taken off of it due to questionable bleeding/requiring blood transfusion -defer to cards.  Aortic stenosis -Aortic valve easily crossed at  cardiac catheterization with minimal gradient , likely mild rather than severe aortic stenosis  History of diabetes mellitus type 2 on insulin -Continue Lantus and sliding scale coverage. HbA1c is 6.6.  History of coronary artery disease, status post CABG -Continue ASA/plavix/beta blocker and statin  History of sleep apnea CPAP  Hyperthyroidism  -Free T4 is 1.28, TSH is low. T3 is normal. -Continue methimazole. Repeat thyroid function tests in 2-3 weeks. H -Endocrine FU  DVT Prophylaxis: Lovenox     Code Status: Full code  Family Communication: Discussed with the patient  Disposition Plan: Continue current management.    LOS: 4 days   [facility]  [facility] Pager [phone]  07/11/2015, 1:30 PM  If 7PM-7AM, please contact night-coverage at [email or web address], password [facility]

## 2015-07-12 LAB — CBC
HCT: 40.1 % (ref 39.0–52.0)
Hemoglobin: 12.7 g/dL — ABNORMAL LOW (ref 13.0–17.0)
MCH: 29.5 pg (ref 26.0–34.0)
MCHC: 31.7 g/dL (ref 30.0–36.0)
MCV: 93 fL (ref 78.0–100.0)
Platelets: 143 10*3/uL — ABNORMAL LOW (ref 150–400)
RBC: 4.31 MIL/uL (ref 4.22–5.81)
RDW: 15.9 % — ABNORMAL HIGH (ref 11.5–15.5)
WBC: 4.7 10*3/uL (ref 4.0–10.5)

## 2015-07-12 LAB — GLUCOSE, CAPILLARY
GLUCOSE-CAPILLARY: 154 mg/dL — AB (ref 65–99)
GLUCOSE-CAPILLARY: 143 mg/dL — AB (ref 65–99)
Glucose-Capillary: 158 mg/dL — ABNORMAL HIGH (ref 65–99)
Glucose-Capillary: 186 mg/dL — ABNORMAL HIGH (ref 65–99)

## 2015-07-12 LAB — BASIC METABOLIC PANEL
Anion gap: 7 (ref 5–15)
BUN: 24 mg/dL — ABNORMAL HIGH (ref 6–20)
CALCIUM: 8.9 mg/dL (ref 8.9–10.3)
CHLORIDE: 106 mmol/L (ref 101–111)
CO2: 25 mmol/L (ref 22–32)
Creatinine, Ser: 1.17 mg/dL (ref 0.61–1.24)
GFR calc Af Amer: >60 mL/min (ref >60)
GLUCOSE: 162 mg/dL — AB (ref 65–99)
POTASSIUM: 4.5 mmol/L (ref 3.5–5.1)
SODIUM: 138 mmol/L (ref 135–145)

## 2015-07-12 MED ORDER — LISINOPRIL 5 MG PO TABS
5.0000 mg | ORAL_TABLET | Freq: Every day | ORAL | Status: DC
  Administered 2015-07-12 – 2015-07-13 (×2): 5 mg via ORAL
  Filled 2015-07-12 (×2): qty 1

## 2015-07-12 MED ORDER — FUROSEMIDE 40 MG PO TABS
40.0000 mg | ORAL_TABLET | Freq: Every day | ORAL | Status: DC
  Administered 2015-07-12 – 2015-07-13 (×2): 40 mg via ORAL
  Filled 2015-07-12 (×2): qty 1

## 2015-07-12 NOTE — Progress Notes (Signed)
[facility] PROGRESS NOTE  Eric Mcguire HQI:[id number] DOB: 04/16/1946 DOA: 07/07/2015  [facility]  Brief HPI: 69 year old African-American male with a past medical history of diabetes, coronary artery disease status post CABG, ICD placement, history of stroke resented with complaints of shortness of breath. Found to have lower extremity edema and pulmonary edema. He was hospitalized for further management.  Past medical history:  Past Medical History  Diagnosis Date  . Stroke Dec 2015    Lt brain, [place]  . Hypertension   . Type 2 diabetes mellitus with renal manifestations   . Coronary artery disease   . 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 [facility]/P CABG x 3 2002    [facility] [place]  . PAF (paroxysmal atrial fibrillation)     not anticoagulated -hx GI bleed  . Ischemic cardiomyopathy Aug 2016    EF 25%   . Aortic stenosis, severe 07/09/2015    Consultants: Cardiology  Procedures:  2-D echocardiogram Study Conclusions - Left ventricle: The cavity size was mildly dilated. There wasmild concentric hypertrophy. Systolic function was severelyreduced. The estimated ejection fraction was in the range of 25%to 30%. Diffuse hypokinesis. There is akinesis of thebasal-midinferolateral and inferior myocardium. Features areconsistent with a pseudonormal left ventricular filling pattern,with concomitant abnormal relaxation and increased fillingpressure (grade 2 diastolic dysfunction). Doppler parameters areconsistent with high ventricular filling pressure. - Aortic valve: Visually there appears to be severe aorticstenosis. The mean gradient of is probably underestimateddue to low cardiac output from low EF. Recommend dobutaminestress echo if clinically indicated to assess further. Severediffuse thickening and calcification. Valve mobility wasrestricted. There was mild regurgitation. Valve area (VTI): 1.28cm^2. Valve area  (Vmax): 1.11 cm^2. Valve area (Vmean): 1.11cm^2. - Mitral valve: There was mild regurgitation. - Left atrium: The atrium was severely dilated. - Right ventricle: The cavity size was moderately dilated. Wallthickness was normal. - Tricuspid valve: There was mild-moderate regurgitation. - Pulmonary arteries: PA peak pressure: 60 mm Hg (S).  Lower extremity venous Dopplers No evidence of DVT, superficial thrombosis, or Baker's cyst.  Antibiotics: None  Subjective: Breathing better, not ambulating yet  Objective: Vital Signs  Filed Vitals:   07/11/15 1439 07/11/15 2014 07/12/15 0602 07/12/15 1324  BP: 127/58 135/61 146/56 140/85  Pulse: 75 78 76 71  Temp: 97.7 F (36.5 C) 98.5 F (36.9 C) 98.3 F (36.8 C) 97.7 F (36.5 C)  TempSrc: Oral Oral Oral Oral  Resp: Height:      Weight:   97.705 kg (215 lb 6.4 oz)   SpO2: 96% 95% 96% 97%    Intake/Output Summary (Last 24 hours) at 07/12/15 1330 Last data filed at 07/12/15 1300  Gross per 24 hour  Intake    960 ml  Output   1325 ml  Net   -365 ml   Filed Weights   07/10/15 0527 07/11/15 0517 07/12/15 0602  Weight: 98.431 kg (217 lb) 97.9 kg (215 lb 13.3 oz) 97.705 kg (215 lb 6.4 oz)    General appearance: alert, cooperative and no distress Resp: Much improved air entry bilaterally with just a few crackles at the bases.  Cardio: regular rate and rhythm, S1, S2 normal, no murmur, click, rub or gallop GI: soft, non-tender; bowel sounds normal; no masses,  no organomegaly Extremities: Improving pitting edema bilateral lower extremities. Neurologic: No focal deficits  Lab Results:  Basic Metabolic Panel:  Recent Labs Lab 07/08/15 0447  07/09/15 0435 07/10/15 0355 07/11/15 0342 07/12/15 0359  NA 138 138 139 134* 138  K 4.5 4.4 4.2 4.3 4.5  CL 103 102 101 100* 106  CO2 27 29 31 28 25   GLUCOSE 130* 140* 120* 101* 162*  BUN 25* 30* 30* 23* 24*  CREATININE 1.43* 1.35* 1.24 1.31* 1.17  CALCIUM 9.2 9.3 9.4  9.0 8.9  MG 1.9  --   --   --   --    Liver Function Tests:  Recent Labs Lab 07/07/15 1321 07/08/15 0447  AST 36 31  ALT 27 21  ALKPHOS 103 90  BILITOT 0.7 0.5  PROT 8.2* 7.1  ALBUMIN 3.6 3.2*   CBC:  Recent Labs Lab 07/07/15 1321 07/08/15 0447 07/09/15 0435 07/10/15 0355 07/12/15 0359  WBC 6.1 4.9 5.1 4.2 4.7  HGB 11.0* 10.6* 11.5* 12.7* 12.7*  HCT 36.0* 33.9* 36.3* 40.4 40.1  MCV 94.7 94.4 93.6 93.1 93.0  PLT 137* 125* 126* 141* 143*   Cardiac Enzymes:  Recent Labs Lab 07/07/15 1321 07/07/15 1930  TROPONINI 0.06* 0.06*   BNP (last 3 results)  Recent Labs  07/07/15 1011 07/08/15 0447  BNP 670.5* 732.5*    CBG:  Recent Labs Lab 07/11/15 1109 07/11/15 1627 07/11/15 2155 07/12/15 0604 07/12/15 1108  GLUCAP 165* 182* 130* 158* 186*    No results found for this or any previous visit (from the past 240 hour(s)).    Studies/Results: No results found.  Medications:  Scheduled: . aspirin  81 mg Oral Daily  . atorvastatin  40 mg Oral Daily  . carvedilol  3.125 mg Oral BID WC  . clopidogrel  75 mg Oral Daily  . enoxaparin (LOVENOX) injection  40 mg Subcutaneous Q24H  . ferrous sulfate  325 mg Oral TID WC  . furosemide  40 mg Oral Daily  . insulin aspart  0-15 Units Subcutaneous TID WC  . insulin glargine  20 Units Subcutaneous BID  . lisinopril  5 mg Oral Daily  . methimazole  5 mg Oral BID  . pantoprazole  40 mg Oral Daily  . senna-docusate  1 tablet Oral BID  . sodium chloride  3 mL Intravenous Q12H   Continuous:   PRN:  Assessment/Plan:  Acute systolic congestive heart failure -Echocardiogram EF is 25-30%.  -Cardiology following -negative 5.7L, lasix and ACE held for Cath -cath with patent grafts -Resume diuretics and ACE today -bmet in am  History of paroxysmal atrial fibrillation -not on anticoagulation, reportedly was taken off of it due to questionable bleeding/requiring blood transfusion -defer to cards  Aortic  stenosis -Aortic valve easily crossed at cardiac catheterization with minimal gradient , likely mild rather than severe aortic stenosis  History of diabetes mellitus type 2 on insulin -Continue Lantus and sliding scale coverage. HbA1c is 6.6.  History of coronary artery disease, status post CABG -Continue ASA/plavix/beta blocker and statin  History of sleep apnea CPAP  Hyperthyroidism  -Free T4 is 1.28, TSH is low. T3 is normal. -Continue methimazole. Repeat thyroid function tests in 2-3 weeks -Endocrine FU  DVT Prophylaxis: Lovenox     Code Status: Full code  Family Communication: Discussed with the patient  Disposition Plan: Continue current management.    LOS: 5 days   [facility]  [facility] Pager [phone]  07/12/2015, 1:30 PM  If 7PM-7AM, please contact night-coverage at [email or web address], password [facility]

## 2015-07-12 NOTE — Progress Notes (Signed)
Primary cardiologist: [facility]  Seen for followup: CAD, cardiomyopathy, aortic valve disease  Subjective:    No chest pain or shortness of breath.  Objective:   Temp:  [97.7 F (36.5 C)-98.5 F (36.9 C)] 98.3 F (36.8 C) (08/14 0602) Pulse Rate:  [75-78] 76 (08/14 0602) Resp:  [19-20] 20 (08/14 0602) BP: (127-146)/(56-61) 146/56 mmHg (08/14 0602) SpO2:  [95 %-96 %] 96 % (08/14 0602) Weight:  [215 lb 6.4 oz (97.705 kg)] 215 lb 6.4 oz (97.705 kg) (08/14 0602) Last BM Date: 07/09/15  Filed Weights   07/10/15 0527 07/11/15 0517 07/12/15 0602  Weight: 217 lb (98.431 kg) 215 lb 13.3 oz (97.9 kg) 215 lb 6.4 oz (97.705 kg)    Intake/Output Summary (Last 24 hours) at 07/12/15 1034 Last data filed at 07/12/15 0700  Gross per 24 hour  Intake    720 ml  Output    925 ml  Net   -205 ml    Telemetry: Sinus rhythm with PVCs.  Exam:  General: Obese male, no distress.  Lungs: Decreased breath sounds without crackles.  Cardiac: RRR with soft systolic murmur.  Abdomen: NABS.  Extremities: Improving edema. Cardiac catheterization site stable.  Lab Results:  Basic Metabolic Panel:  Recent Labs Lab 07/08/15 0447  07/10/15 0355 07/11/15 0342 07/12/15 0359  NA 138  < > 139 134* 138  K 4.5  < > 4.2 4.3 4.5  CL 103  < > 101 100* 106  CO2 27  < > 31 28 25   GLUCOSE 130*  < > 120* 101* 162*  BUN 25*  < > 30* 23* 24*  CREATININE 1.43*  < > 1.24 1.31* 1.17  CALCIUM 9.2  < > 9.4 9.0 8.9  MG 1.9  --   --   --   --   < > = values in this interval not displayed.  Liver Function Tests:  Recent Labs Lab 07/07/15 1321 07/08/15 0447  AST 36 31  ALT 27 21  ALKPHOS 103 90  BILITOT 0.7 0.5  PROT 8.2* 7.1  ALBUMIN 3.6 3.2*    CBC:  Recent Labs Lab 07/09/15 0435 07/10/15 0355 07/12/15 0359  WBC 5.1 4.2 4.7  HGB 11.5* 12.7* 12.7*  HCT 36.3* 40.4 40.1  MCV 93.6 93.1 93.0  PLT 126* 141* 143*    Cardiac Enzymes:  Recent Labs Lab 07/07/15 1321  07/07/15 1930  TROPONINI 0.06* 0.06*    Coagulation:  Recent Labs Lab 07/08/15 0447 07/10/15 0355  INR 1.29 1.28    Cardiac catheterization 07/10/2015: Conclusion     Patent LIMA to LAD. Patent SVG to OM. Patent stents in the proximal RCA with diffuse disease in this apparent nondominant vessel.  Aortic valve was easily crossed with a JR4 catheter, without a wire. There was only a minimal gradient at pullback.  Mild pulmonary artery hypertension.  Normal LVEDP.  Mid LAD-1 lesion, 25% stenosed.  Mid LAD-2 lesion, 80% stenosed.  Continue medical therapy for left ventricular dysfunction. It does not appear that further evaluation of the aortic valve is necessary.  Cardiology follow-up with Dr. Allyson Sabal.     Coronary Findings    Dominance: Left   Left Anterior Descending   . Mid LAD-1 lesion, 25% stenosed.   . Mid LAD-2 lesion, 80% stenosed.     Left Circumflex  There is moderate the vessel.   Suezanne Jacquet Cx lesion, 80% stenosed. The lesion is type C located at the major branch .   Marland Kitchen  First Obtuse Marginal Branch   There is moderate in the vessel.     Right Coronary Artery  The vessel is small .   Marland Kitchen Ost RCA to Prox RCA lesion, 50% stenosed.   . Prox RCA lesion, 10% stenosed. The lesion was previously treated with a stent (unknown type) .   Marland Kitchen Acute Marginal Branch   . Acute Mrg lesion, 90% stenosed.     Graft Angiography    LIMA Graft to Dist LAD  is moderate in size, and is anatomically normal.     Graft to 4th Mrg           Right Heart Pressures Hemodynamic findings consistent with mild pulmonary hypertension. LV EDP is normal.    Left Heart    Left Ventricle The left ventricle is enlarged.   Aortic Valve There is mild aortic valve stenosis. Were able to cross the aortic valve with just the JR4 catheter and no wire. There was only a minimal gradient at pullback.    Medications:   Scheduled Medications: . aspirin  81 mg Oral Daily  .  atorvastatin  40 mg Oral Daily  . carvedilol  3.125 mg Oral BID WC  . clopidogrel  75 mg Oral Daily  . enoxaparin (LOVENOX) injection  40 mg Subcutaneous Q24H  . ferrous sulfate  325 mg Oral TID WC  . insulin aspart  0-15 Units Subcutaneous TID WC  . insulin glargine  20 Units Subcutaneous BID  . methimazole  5 mg Oral BID  . pantoprazole  40 mg Oral Daily  . senna-docusate  1 tablet Oral BID  . sodium chloride  3 mL Intravenous Q12H    PRN Medications: sodium chloride, acetaminophen, ondansetron (ZOFRAN) IV, sodium chloride   Assessment:   1. Acute on chronic combined heart failure, LVEF 25-30% with grade 2 diastolic dysfunction. Patient has had substantial diuresis during hospital stay, weight down approximately 30 pounds.  2. CAD status post CABG and prior PCI, bypass grafts and RCA stents patent at cardiac catheterization. Normal cardiac index, moderate to severe pulmonary hypertension, normal LVEDP.  3. Aortic valve easily crossed at cardiac catheterization with minimal gradient at pullback, suggests mild rather than severe aortic stenosis.  4. CKD stage 3, creatinine 1.1. ACE inhibitor and diuretic held with recent angiography.  5. Hyperlipidemia, on statin therapy.  6. Ischemic cardiomyopathy status post ICD placement.  7. Reported history of PAF, currently in sinus rhythm. Not anticoagulated with history of prior GI bleeding.   Plan/Discussion:    Continue aspirin, Coreg, Lipitor, and Plavix. Will resume low-dose ACE inhibitor as well as previous dose of Lasix. Follow-up BMET in a.m.   Jonelle Sidle, M.D., F.A.C.C.

## 2015-07-13 ENCOUNTER — Encounter (HOSPITAL_COMMUNITY): Payer: Self-pay | Admitting: Interventional Cardiology

## 2015-07-13 DIAGNOSIS — R946 Abnormal results of thyroid function studies: Secondary | ICD-10-CM

## 2015-07-13 LAB — BASIC METABOLIC PANEL
ANION GAP: 7 (ref 5–15)
BUN: 22 mg/dL — AB (ref 6–20)
CHLORIDE: 106 mmol/L (ref 101–111)
CO2: 24 mmol/L (ref 22–32)
Calcium: 8.8 mg/dL — ABNORMAL LOW (ref 8.9–10.3)
Creatinine, Ser: 1.28 mg/dL — ABNORMAL HIGH (ref 0.61–1.24)
GFR calc Af Amer: >60 mL/min (ref >60)
GFR calc non Af Amer: 55 mL/min — ABNORMAL LOW (ref >60)
GLUCOSE: 165 mg/dL — AB (ref 65–99)
POTASSIUM: 4.1 mmol/L (ref 3.5–5.1)
SODIUM: 137 mmol/L (ref 135–145)

## 2015-07-13 LAB — CBC
HCT: 41.2 % (ref 39.0–52.0)
Hemoglobin: 13.1 g/dL (ref 13.0–17.0)
MCH: 29.6 pg (ref 26.0–34.0)
MCHC: 31.8 g/dL (ref 30.0–36.0)
MCV: 93.2 fL (ref 78.0–100.0)
PLATELETS: 130 10*3/uL — AB (ref 150–400)
RBC: 4.42 MIL/uL (ref 4.22–5.81)
RDW: 16 % — ABNORMAL HIGH (ref 11.5–15.5)
WBC: 5.2 10*3/uL (ref 4.0–10.5)

## 2015-07-13 LAB — GLUCOSE, CAPILLARY
GLUCOSE-CAPILLARY: 176 mg/dL — AB (ref 65–99)
Glucose-Capillary: 130 mg/dL — ABNORMAL HIGH (ref 65–99)
Glucose-Capillary: 158 mg/dL — ABNORMAL HIGH (ref 65–99)

## 2015-07-13 MED ORDER — LISINOPRIL 5 MG PO TABS: 5.0000 mg | ORAL_TABLET | Freq: Every day | ORAL | Status: AC

## 2015-07-13 MED ORDER — METHIMAZOLE 5 MG PO TABS: 5.0000 mg | ORAL_TABLET | Freq: Two times a day (BID) | ORAL | Status: DC

## 2015-07-13 MED ORDER — INSULIN NPH ISOPHANE & REGULAR (70-30) 100 UNIT/ML ~~LOC~~ SUSP: 50.0000 [IU] | Freq: Two times a day (BID) | SUBCUTANEOUS | Status: DC

## 2015-07-13 MED ORDER — FUROSEMIDE 40 MG PO TABS: 40.0000 mg | ORAL_TABLET | Freq: Every day | ORAL | Status: DC

## 2015-07-13 NOTE — Evaluation (Signed)
Physical Therapy Evaluation Patient Details Name: Eric Mcguire MRN: [id number] DOB: 1946/01/04 Today's Date: 07/13/2015   History of Present Illness  69 year old African-American male with a past medical history of diabetes, coronary artery disease status post CABG, ICD placement, history of stroke resented with complaints of shortness of breath. Found to have lower extremity edema and pulmonary edema.  Clinical Impression  Patient presents with no current skilled PT needs as he is at functional baseline and demonstrates no functional deficits.  Educated him in fall prevention techniques and energy conservation.  Will sign off.  Patient anticipates d/c home later today.    Follow Up Recommendations No PT follow up    Equipment Recommendations  None recommended by PT    Recommendations for Other Services       Precautions / Restrictions Precautions Precautions: None      Mobility  Bed Mobility Overal bed mobility: Modified Independent                Transfers Overall transfer level: Modified independent                  Ambulation/Gait Ambulation/Gait assistance: Independent   Assistive device: None Gait Pattern/deviations: WFL(Within Functional Limits)        Stairs            Wheelchair Mobility    Modified Rankin (Stroke Patients Only)       Balance Overall balance assessment: Independent (able to walk with head turns and 180 turn and stop no loss of balance)                                           Pertinent Vitals/Pain Pain Assessment: No/denies pain    Home Living Family/patient expects to be discharged to:: Private residence Living Arrangements: Alone   Type of Home: Apartment Home Access: Level entry     Home Layout: One level Home Equipment: None      Prior Function Level of Independence: Independent               Hand Dominance   Dominant Hand: Right    Extremity/Trunk Assessment   Upper Extremity Assessment: Overall WFL for tasks assessed           Lower Extremity Assessment: Overall WFL for tasks assessed         Communication   Communication: No difficulties  Cognition Arousal/Alertness: Awake/alert Behavior During Therapy: WFL for tasks assessed/performed Overall Cognitive Status: Within Functional Limits for tasks assessed                      General Comments      Exercises        Assessment/Plan    PT Assessment Patent does not need any further PT services  PT Diagnosis Generalized weakness   PT Problem List    PT Treatment Interventions     PT Goals (Current goals can be found in the Care Plan section) Acute Rehab PT Goals PT Goal Formulation: All assessment and education complete, DC therapy    Frequency     Barriers to discharge        Co-evaluation               End of Session   Activity Tolerance: Patient tolerated treatment well Patient left: in bed;with call bell/phone within reach  Time: 8657-8469 PT Time Calculation (min) (ACUTE ONLY): 10 min   Charges:   PT Evaluation $Initial PT Evaluation Tier I: 1 Procedure     PT G Codes:        WYNN,CYNDI 2015-08-10, 11:42 AM  Sheran Lawless, PT [phone] August 10, 2015

## 2015-07-13 NOTE — Care Management Note (Addendum)
Case Management Note Donn Pierini RN, BSN Unit 2W-Case Manager [phone]  Patient Details  Name: Eric Mcguire MRN: [id number] Date of Birth: 1946-04-09  Subjective/Objective:    Pt admitted with CHF                Action/Plan: PTA pt lived at home, plan to return home - per pt he goes to the [facility] for PCP needs and meds. He is trying to get his [facility] to [facility] [place] [facility] and is working with the [place] to do this.  ? PCP Loralee Pacas  Expected Discharge Date:  07/13/15               Expected Discharge Plan:  Home w Home Health Services  In-House Referral:     Discharge planning Services  CM Consult  Post Acute Care Choice:  Home Health Choice offered to:  Patient  DME Arranged:  N/A DME Agency:  NA  HH Arranged:  RN, Disease Management HH Agency:  [facility]  Status of Service:  Completed, signed off  Medicare Important Message Given:  Yes-third notification given Date Medicare IM Given:    Medicare IM give by:    Date Additional Medicare IM Given:    Additional Medicare Important Message give by:     If discussed at Long Length of Stay Meetings, dates discussed:    Additional Comments:  07/14/15- 0830- received return call from [facility]- Selinda Michaels- pt's PCP is Dr.  Judie Grieve- Bonita Quin unable to give first name of PCP- have faxed Linda H&P and D/C summary to the [facility] [phone]) for her to forward on to the pt's PCP for [facility] RN needs- have requested that if [facility] approved to please fax auth# to [facility] for services. (fax # to [facility] provided to [place] at the [facility]) Have updated Clydie Braun with [facility] regarding conversation with the [facility] this AM and awaiting auth from them.   07/13/15- pt for d/c home today, orders for HH-RN for HF management- spoke with pt and offered choice for [facility] agency in [facility]- per pt he does not have a preference and states that [facility] is ok to use- referral called to Clydie Braun with [facility] for HH-RN (CHF management)- pt states that he  does not have any other needs.  Call made to [facility]- message left for Lawson Fiscal regarding HH needs- and to verify PCP- return call received from [place]- and was told to call Selinda Michaels at the [facility]- ext. [phone]- message has also been left for Bonita Quin- call also received from April at the [facility] regarding d/c plans- informed April of pt's discharge for today- [facility] unable to verify pt's PCP in the system. Will await Linda's return call as HH can not be confirmed without [facility] auth.   Darrold Span, RN 07/13/2015, 11:46 AM

## 2015-07-13 NOTE — Progress Notes (Addendum)
SUBJECTIVE:  Feels well.  Breathing easily lying flat  OBJECTIVE:   Vitals:   Filed Vitals:   07/12/15 0602 07/12/15 1324 07/12/15 2033 07/13/15 0500  BP: 146/56 140/85 140/58 135/39  Pulse: 76 71 75 85  Temp: 98.3 F (36.8 C) 97.7 F (36.5 C) 98.4 F (36.9 C) 98.8 F (37.1 C)  TempSrc: Oral Oral Oral Oral  Resp: 20 19 16 16   Height:      Weight: 215 lb 6.4 oz (97.705 kg)   215 lb 14.4 oz (97.932 kg)  SpO2: 96% 97% 99% 98%   I&O's:   Intake/Output Summary (Last 24 hours) at 07/13/15 0949 Last data filed at 07/13/15 0700  Gross per 24 hour  Intake    483 ml  Output   1300 ml  Net   -817 ml   TELEMETRY: Reviewed telemetry pt in NSR:     PHYSICAL EXAM General: Well developed, well nourished, in no acute distress Head:   Normal cephalic and atramatic  Lungs:   Clear bilaterally to auscultation. Heart:   HRRR S1 S2  No JVD.   Abdomen: abdomen soft and non-tender Msk:  Back normal,  Normal strength and tone for age. Extremities:   No edema.  2+ right DP pulse, no right groin hematoma Neuro: Alert and oriented. Psych:  Normal affect, responds appropriately Skin: No rash   LABS: Basic Metabolic Panel:  Recent Labs  40/98/11 0359 07/13/15 0434  NA 138 137  K 4.5 4.1  CL 106 106  CO2 25 24  GLUCOSE 162* 165*  BUN 24* 22*  CREATININE 1.17 1.28*  CALCIUM 8.9 8.8*   Liver Function Tests: No results for input(s): AST, ALT, ALKPHOS, BILITOT, PROT, ALBUMIN in the last 72 hours. No results for input(s): LIPASE, AMYLASE in the last 72 hours. CBC:  Recent Labs  07/12/15 0359 07/13/15 0434  WBC 4.7 5.2  HGB 12.7* 13.1  HCT 40.1 41.2  MCV 93.0 93.2  PLT 143* 130*   Cardiac Enzymes: No results for input(s): CKTOTAL, CKMB, CKMBINDEX, TROPONINI in the last 72 hours. BNP: Invalid input(s): POCBNP D-Dimer: No results for input(s): DDIMER in the last 72 hours. Hemoglobin A1C: No results for input(s): HGBA1C in the last 72 hours. Fasting Lipid Panel: No  results for input(s): CHOL, HDL, LDLCALC, TRIG, CHOLHDL, LDLDIRECT in the last 72 hours. Thyroid Function Tests: No results for input(s): TSH, T4TOTAL, T3FREE, THYROIDAB in the last 72 hours.  Invalid input(s): FREET3 Anemia Panel: No results for input(s): VITAMINB12, FOLATE, FERRITIN, TIBC, IRON, RETICCTPCT in the last 72 hours. Coag Panel:   Lab Results  Component Value Date   INR 1.28 07/10/2015   INR 1.29 07/08/2015    RADIOLOGY: Dg Chest 2 View  07/07/2015   CLINICAL DATA:  Short of breath.  Anasarca.  Cough.  EXAM: CHEST  2 VIEW  COMPARISON:  05/17/2015  FINDINGS: Cardiac enlargement with vascular congestion which has progressed. Mild interstitial edema and small pleural effusions bilaterally.  AICD remains in good position.  IMPRESSION: Congestive heart failure with mild edema and small pleural effusions.   Electronically Signed   By: Marlan Palau M.D.   On: 07/07/2015 10:23      ASSESSMENT: Eric Mcguire:    Ischemic cardiomyopathy: s/p AICD.  No need for PCI at caht on Friday.  Diuresed well.  No need for oxygen.  He states he is walking around without difficulty.   Low dose ACE and diuretic restarted.  Cardiology f/u has been with the [place].  OK to f/u there for further med titration.  BP stable at this time. Continue current doses of meds.   Aortic stenosis: Not severe at cath.  No further cardiac testing planned.  If he wants to f[facility]u [facility] instead of [facility], would arrange f/u with Dr. Allyson Sabal.     Corky Crafts, MD  07/13/2015  9:49 AM

## 2015-07-13 NOTE — Procedures (Signed)
Pt refuses CPAP for the night.  

## 2015-07-13 NOTE — Discharge Summary (Signed)
Physician Discharge Summary  Eric Mcguire:[id number] DOB: 1946/05/24 DOA: 07/07/2015  PCP: No primary care provider on file.  Admit date: 07/07/2015 Discharge date: 07/13/2015  Time spent: 45 minutes  Recommendations for Outpatient Follow-up:  1. DR.Jonathan Berry in 2-3weeks, office to call 2. PCP at [facility] in 1 wek 3. Dr. Debara Pickett Endocrinology in 1 month 4. Home health RN for disease management set up  Discharge Diagnoses:  Principal Problem:   Acute on chronic combined systolic and diastolic congestive heart failure Active Problems:   S/P CABG x 3- 2003 ([place])   Type 2 diabetes with nephropathy   CVA- DEC 2015 [facility])   Essential hypertension   Dyslipidemia   Renal insufficiency-stage 3- presumably chronic   ICD in place -BS-(implanted July 2013 [place])   Low TSH level   ICM-EF 25% 2D 07/07/15   Diastolic dysfunction-grade 2   Pulmonary hyperinflation-moderate   PVD- LCE Dec 2015   Aortic stenosis, severe   Hyperthyroidism   Discharge Condition: stable  Diet recommendation: low sodium, diabetic  Filed Weights   07/11/15 0517 07/12/15 0602 07/13/15 0500  Weight: 97.9 kg (215 lb 13.3 oz) 97.705 kg (215 lb 6.4 oz) 97.932 kg (215 lb 14.4 oz)    History of present illness:  Chief Complaint: sob, edema HPI: Eric Mcguire is a 69 y.o. male with h/o hypertension, insulin dependent diabetes, paroxysmal atrial fibrillation, not on anticoagulation, h/o coronary artery disease s/p 3 vessel CABG, pacemaker implant , and CVA November 2015 with residual right hand weakness, presenting with progressive shortness of breath and bilateral lower extremity swelling for approximately 2 weeks . Patient sought medical treatment at an outside facility Thursday 07/02/15. Diagnosed with pneumonia, prescribed Atrovent, and sent home. Shortness of breath and swelling continued to worsen, prompting patient to come to the ED.   Hospital Course:  Acute systolic congestive heart  failure -Echocardiogram EF is 25-30%.  -Followed by Cardiology, diuresed aggressively with IV lasix -cath with patent grafts and no severe AS noted -Resumed diuretics and ACE post cath yesterday, clinically stable and improved and will be discharged home on PO lasix and AC,advised to FU with Cardiology at [facility] or [facility] heart care Dr.Berry  History of paroxysmal atrial fibrillation -not on anticoagulation, reportedly was taken off of it due to questionable bleeding/requiring blood transfusion -defer to cardiologist at [facility]  Aortic stenosis -Aortic valve easily crossed at cardiac catheterization with minimal gradient , likely mild rather than severe aortic stenosis  History of diabetes mellitus type 2 on insulin -Continue Insulin 70/30 and sliding scale coverage. HbA1c is 6.6.  History of coronary artery disease, status post CABG -Continue ASA/plavix/beta blocker and statin  History of sleep apnea CPAP  Hyperthyroidism  -Free T4 is 1.28, TSH is low. T3 is normal. -started on methimazole. Repeat thyroid function tests in 2-3 weeks -Endocrine FU needed   Procedures: Left Heart cath:  Conclusion     Patent LIMA to LAD. Patent SVG to OM. Patent stents in the proximal RCA with diffuse disease in this apparent nondominant vessel.  Aortic valve was easily crossed with a JR4 catheter, without a wire. There was only a minimal gradient at pullback.  Mild pulmonary artery hypertension.  Normal LVEDP.  Mid LAD-1 lesion, 25% stenosed.  Mid LAD-2 lesion, 80% stenosed.  Continue medical therapy for left ventricular dysfunction. It does not appear that further evaluation of the aortic valve is necessary.   Consultations:  Cardiology  Discharge Exam: Filed Vitals:   07/13/15 0500  BP:  135/39  Pulse: 85  Temp: 98.8 F (37.1 C)  Resp: 16    General: AAOx3 Cardiovascular: S1S2/RRR Respiratory: CTAB  Discharge Instructions   Discharge Instructions    Diet - low sodium heart  healthy    Complete by:  As directed      Diet Carb Modified    Complete by:  As directed      Increase activity slowly    Complete by:  As directed           Current Discharge Medication List    START taking these medications   Details  furosemide (LASIX) 40 MG tablet Take 1 tablet (40 mg total) by mouth daily. Qty: 30 tablet, Refills: 0    methimazole (TAPAZOLE) 5 MG tablet Take 1 tablet (5 mg total) by mouth 2 (two) times daily. Qty: 30 tablet, Refills: 0      CONTINUE these medications which have CHANGED   Details  insulin NPH-regular Human (NOVOLIN 70/30) (70-30) 100 UNIT/ML injection Inject 50-55 Units into the skin 2 (two) times daily. 45 units in the am and 40 units at night.    lisinopril (PRINIVIL,ZESTRIL) 5 MG tablet Take 1 tablet (5 mg total) by mouth daily. Qty: 30 tablet, Refills: 0      CONTINUE these medications which have NOT CHANGED   Details  aspirin 81 MG tablet Take 81 mg by mouth daily.    atorvastatin (LIPITOR) 80 MG tablet Take 40 mg by mouth daily.     carvedilol (COREG) 3.125 MG tablet Take 3.125 mg by mouth 2 (two) times daily with a meal.    cholecalciferol (VITAMIN D) 1000 UNITS tablet Take 1,000 Units by mouth daily.    clopidogrel (PLAVIX) 75 MG tablet Take 75 mg by mouth daily.    ferrous sulfate 325 (65 FE) MG tablet Take 1 tablet (325 mg total) by mouth 3 (three) times daily with meals. Qty: 90 tablet, Refills: 0    magnesium oxide (MAG-OX) 400 MG tablet Take 400 mg by mouth daily.    OVER THE COUNTER MEDICATION Take 10 mg by mouth daily as needed (allergies).    pantoprazole (PROTONIX) 40 MG tablet Take 40 mg by mouth daily.    acetaminophen (TYLENOL 8 HOUR) 650 MG CR tablet Take 1 tablet (650 mg total) by mouth every 8 (eight) hours as needed for pain. Qty: 30 tablet, Refills: 0      STOP taking these medications     amLODipine (NORVASC) 10 MG tablet      ibuprofen (ADVIL,MOTRIN) 200 MG tablet      triamcinolone (NASACORT  ALLERGY 24HR) 55 MCG/ACT AERO nasal inhaler      cephALEXin (KEFLEX) 500 MG capsule        Allergies  Allergen Reactions  . Prednisone Other (See Comments)    Stroke    Follow-up Information    Follow up with Nanetta Batty, MD. Schedule an appointment as soon as possible for a visit in 2 weeks.   Specialties:  Cardiology, Radiology   Contact information:   [address] [place] [address] [phone]       Follow up with PCP at [facility]. Schedule an appointment as soon as possible for a visit in 1 week.      Follow up with KERR,JEFFREY, MD. Schedule an appointment as soon as possible for a visit in 1 month.   Specialty:  Endocrinology   Why:  for abnormal Thyroid hormone levels   Contact information:  [address] [place] [address] [phone]        The results of significant diagnostics from this hospitalization (including imaging, microbiology, ancillary and laboratory) are listed below for reference.    Significant Diagnostic Studies: Dg Chest 2 View  07/07/2015   CLINICAL DATA:  Short of breath.  Anasarca.  Cough.  EXAM: CHEST  2 VIEW  COMPARISON:  05/17/2015  FINDINGS: Cardiac enlargement with vascular congestion which has progressed. Mild interstitial edema and small pleural effusions bilaterally.  AICD remains in good position.  IMPRESSION: Congestive heart failure with mild edema and small pleural effusions.   Electronically Signed   By: Marlan Palau M.D.   On: 07/07/2015 10:23    Microbiology: No results found for this or any previous visit (from the past 240 hour(s)).   Labs: Basic Metabolic Panel:  Recent Labs Lab 07/08/15 0447 07/09/15 0435 07/10/15 0355 07/11/15 0342 07/12/15 0359 07/13/15 0434  NA 138 138 139 134* 138 137  K 4.5 4.4 4.2 4.3 4.5 4.1  CL 103 102 101 100* 106 106  CO2 27 29 31 28 25 24   GLUCOSE 130* 140* 120* 101* 162* 165*  BUN 25* 30* 30* 23* 24* 22*  CREATININE 1.43* 1.35* 1.24 1.31* 1.17  1.28*  CALCIUM 9.2 9.3 9.4 9.0 8.9 8.8*  MG 1.9  --   --   --   --   --    Liver Function Tests:  Recent Labs Lab 07/07/15 1321 07/08/15 0447  AST 36 31  ALT 27 21  ALKPHOS 103 90  BILITOT 0.7 0.5  PROT 8.2* 7.1  ALBUMIN 3.6 3.2*   No results for input(s): LIPASE, AMYLASE in the last 168 hours. No results for input(s): AMMONIA in the last 168 hours. CBC:  Recent Labs Lab 07/08/15 0447 07/09/15 0435 07/10/15 0355 07/12/15 0359 07/13/15 0434  WBC 4.9 5.1 4.2 4.7 5.2  HGB 10.6* 11.5* 12.7* 12.7* 13.1  HCT 33.9* 36.3* 40.4 40.1 41.2  MCV 94.4 93.6 93.1 93.0 93.2  PLT 125* 126* 141* 143* 130*   Cardiac Enzymes:  Recent Labs Lab 07/07/15 1321 07/07/15 1930  TROPONINI 0.06* 0.06*   BNP: BNP (last 3 results)  Recent Labs  07/07/15 1011 07/08/15 0447  BNP 670.5* 732.5*    ProBNP (last 3 results) No results for input(s): PROBNP in the last 8760 hours.  CBG:  Recent Labs Lab 07/12/15 0604 07/12/15 1108 07/12/15 1618 07/12/15 2151 07/13/15 0644  GLUCAP 158* 186* 154* 143* 176*       Signed:  Latona Krichbaum  [facility] 07/13/2015, 11:37 AM

## 2015-07-13 NOTE — Care Management Important Message (Signed)
Important Message  Patient Details  Name: Eric Mcguire MRN: [id number] Date of Birth: 07-10-1946   Medicare Important Message Given:  Yes-third notification given    Kyla Balzarine 07/13/2015, 11:18 AMImportant Message  Patient Details  Name: Eric Mcguire MRN: [id number] Date of Birth: 05/11/1946   Medicare Important Message Given:  Yes-third notification given    Kyla Balzarine 07/13/2015, 11:18 AM

## 2015-07-13 NOTE — Progress Notes (Signed)
Patient discharged per orders. All d/c orders/instructions/teaching discussed including medications, prescriptions, follow up appointments, home care, daily weights, diet, activity, when to call the doctor. Time allowed for questions and concerns. Patient stated he had none. Patient d/c'd via wheelchair by Lavona [place] for transport home by patient's niece.  Asher Muir Maimuna Leaman,RN

## 2015-08-04 ENCOUNTER — Emergency Department (HOSPITAL_COMMUNITY)
Admission: EM | Admit: 2015-08-04 | Discharge: 2015-08-05 | Disposition: A | Discharge status: Home / Self Care | Payer: Non-veteran care | Attending: Emergency Medicine | Admitting: Emergency Medicine

## 2015-08-04 ENCOUNTER — Encounter (HOSPITAL_COMMUNITY): Payer: Self-pay | Admitting: Emergency Medicine

## 2015-08-04 DIAGNOSIS — Z794 Long term (current) use of insulin: Secondary | ICD-10-CM | POA: Diagnosis not present

## 2015-08-04 DIAGNOSIS — I2510 Atherosclerotic heart disease of native coronary artery without angina pectoris: Secondary | ICD-10-CM | POA: Insufficient documentation

## 2015-08-04 DIAGNOSIS — Y998 Other external cause status: Secondary | ICD-10-CM | POA: Insufficient documentation

## 2015-08-04 DIAGNOSIS — Z87891 Personal history of nicotine dependence: Secondary | ICD-10-CM | POA: Insufficient documentation

## 2015-08-04 DIAGNOSIS — Z23 Encounter for immunization: Secondary | ICD-10-CM | POA: Diagnosis not present

## 2015-08-04 DIAGNOSIS — E119 Type 2 diabetes mellitus without complications: Secondary | ICD-10-CM | POA: Diagnosis not present

## 2015-08-04 DIAGNOSIS — I10 Essential (primary) hypertension: Secondary | ICD-10-CM | POA: Diagnosis not present

## 2015-08-04 DIAGNOSIS — Y9289 Other specified places as the place of occurrence of the external cause: Secondary | ICD-10-CM | POA: Diagnosis not present

## 2015-08-04 DIAGNOSIS — Z7982 Long term (current) use of aspirin: Secondary | ICD-10-CM | POA: Diagnosis not present

## 2015-08-04 DIAGNOSIS — W228XXA Striking against or struck by other objects, initial encounter: Secondary | ICD-10-CM | POA: Diagnosis not present

## 2015-08-04 DIAGNOSIS — S99921A Unspecified injury of right foot, initial encounter: Secondary | ICD-10-CM | POA: Diagnosis present

## 2015-08-04 DIAGNOSIS — Y9389 Activity, other specified: Secondary | ICD-10-CM | POA: Diagnosis not present

## 2015-08-04 DIAGNOSIS — Z7902 Long term (current) use of antithrombotics/antiplatelets: Secondary | ICD-10-CM | POA: Diagnosis not present

## 2015-08-04 DIAGNOSIS — Z9889 Other specified postprocedural states: Secondary | ICD-10-CM | POA: Diagnosis not present

## 2015-08-04 DIAGNOSIS — I480 Paroxysmal atrial fibrillation: Secondary | ICD-10-CM | POA: Diagnosis not present

## 2015-08-04 DIAGNOSIS — Z8673 Personal history of transient ischemic attack (TIA), and cerebral infarction without residual deficits: Secondary | ICD-10-CM | POA: Diagnosis not present

## 2015-08-04 DIAGNOSIS — S90811A Abrasion, right foot, initial encounter: Secondary | ICD-10-CM | POA: Diagnosis not present

## 2015-08-04 DIAGNOSIS — Z79899 Other long term (current) drug therapy: Secondary | ICD-10-CM | POA: Insufficient documentation

## 2015-08-04 DIAGNOSIS — Z95810 Presence of automatic (implantable) cardiac defibrillator: Secondary | ICD-10-CM | POA: Diagnosis not present

## 2015-08-04 MED ORDER — "THROMBI-PAD 3""X3"" EX PADS"
1.0000 | MEDICATED_PAD | Freq: Once | CUTANEOUS | Status: AC
  Administered 2015-08-04: 1 via TOPICAL

## 2015-08-04 MED ORDER — TETANUS-DIPHTH-ACELL PERTUSSIS 5-2.5-18.5 LF-MCG/0.5 IM SUSP
0.5000 mL | Freq: Once | INTRAMUSCULAR | Status: AC
  Administered 2015-08-04: 0.5 mL via INTRAMUSCULAR
  Filled 2015-08-04: qty 0.5

## 2015-08-04 NOTE — ED Notes (Signed)
Pt has right foot laceration-scraped on a plastic stool from [employer]. Unsure of last tetanus shot. No other c/c.

## 2015-08-04 NOTE — Discharge Instructions (Signed)

## 2015-08-04 NOTE — ED Provider Notes (Signed)
CSN: [id number]     Arrival date & time 08/04/15  2243 History  This chart was scribed for Eric Anis, PA-C working with Benjiman Core, MD by Evon Slack, ED Scribe. This patient was seen in room WTR8/WTR8 and the patient's care was started at 11:33 PM.      Chief Complaint  Patient presents with  . Extremity Laceration    right   The history is provided by the patient. No language interpreter was used.   HPI Comments: Mikias Lanz is a 69 y.o. male who presents to the Emergency Department complaining of laceration to the right foot onset tonight PTA. Pt states that he scraped his foot on a plastic stool. He states that the bleeding is not controlled. Pt states that he is on anticoagulants. Pt states that his tetanus is not UTD. Pt doesn't report any other symptoms.   Past Medical History  Diagnosis Date  . Stroke Dec 2015    Lt brain, [place]  . Hypertension   . Type 2 diabetes mellitus with renal manifestations   . Coronary artery disease   . ICD (implantable cardioverter-defibrillator), dual, in situ July 2013    BS implanted in [facility]  . S/P CABG x 3 [facility] [place]  . PAF (paroxysmal atrial fibrillation)     not anticoagulated -hx GI bleed  . Ischemic cardiomyopathy Aug 2016    EF 25%   . Aortic stenosis, severe 07/09/2015   Past Surgical History  Procedure Laterality Date  . Cardiac surgery    . Ep implantable device  July 2013    BS duel ICD  . [place]  . Cardiac catheterization N/A 07/10/2015    Procedure: Right/Left Heart Cath and Coronary Angiography;  Surgeon: Corky Crafts, MD;  Location: [facility] INVASIVE CV LAB;  Service: Cardiovascular;  Laterality: N/A;  . Peripheral vascular catheterization  07/10/2015    Procedure: Thoracic Aortogram;  Surgeon: Corky Crafts, MD;  Location: [facility] INVASIVE CV LAB;  Service: Cardiovascular;;   History reviewed. No pertinent family history. Social History  Substance Use Topics  . Smoking  status: Former Smoker    Quit date: 12/04/1994  . Smokeless tobacco: None  . Alcohol Use: No    Review of Systems  Musculoskeletal:       See HPI.  Skin: Positive for wound.  All other systems reviewed and are negative.   Allergies  Prednisone  Home Medications   Prior to Admission medications   Medication Sig Start Date End Date Taking? Authorizing Provider  acetaminophen (TYLENOL 8 HOUR) 650 MG CR tablet Take 1 tablet (650 mg total) by mouth every 8 (eight) hours as needed for pain. Patient not taking: Reported on 05/17/2015 01/28/15   Emilia Beck, PA-C  aspirin 81 MG tablet Take 81 mg by mouth daily.    Historical Provider, MD  atorvastatin (LIPITOR) 80 MG tablet Take 40 mg by mouth daily.     Historical Provider, MD  carvedilol (COREG) 3.125 MG tablet Take 3.125 mg by mouth 2 (two) times daily with a meal.    Historical Provider, MD  cholecalciferol (VITAMIN D) 1000 UNITS tablet Take 1,000 Units by mouth daily.    Historical Provider, MD  clopidogrel (PLAVIX) 75 MG tablet Take 75 mg by mouth daily.    Historical Provider, MD  ferrous sulfate 325 (65 FE) MG tablet Take 1 tablet (325 mg total) by mouth 3 (three) times daily with meals.  05/17/15   Garlon Hatchet, PA-C  furosemide (LASIX) 40 MG tablet Take 1 tablet (40 mg total) by mouth daily. 07/13/15   Zannie Cove, MD  insulin NPH-regular Human (NOVOLIN 70/30) (70-30) 100 UNIT/ML injection Inject 50-55 Units into the skin 2 (two) times daily. 45 units in the am and 40 units at night. 07/13/15   Zannie Cove, MD  lisinopril (PRINIVIL,ZESTRIL) 5 MG tablet Take 1 tablet (5 mg total) by mouth daily. 07/13/15   Zannie Cove, MD  magnesium oxide (MAG-OX) 400 MG tablet Take 400 mg by mouth daily.    Historical Provider, MD  methimazole (TAPAZOLE) 5 MG tablet Take 1 tablet (5 mg total) by mouth 2 (two) times daily. 07/13/15   Zannie Cove, MD  OVER THE COUNTER MEDICATION Take 10 mg by mouth daily as needed (allergies).     Historical Provider, MD  pantoprazole (PROTONIX) 40 MG tablet Take 40 mg by mouth daily.    Historical Provider, MD   BP 133/50 mmHg  Pulse 88  Temp(Src) 97.8 F (36.6 C) (Oral)  Resp 16  SpO2 98%   Physical Exam  Constitutional: He is oriented to person, place, and time. He appears well-developed and well-nourished. No distress.  HENT:  Head: Normocephalic and atraumatic.  Eyes: Conjunctivae and EOM are normal.  Neck: Neck supple. No tracheal deviation present.  Cardiovascular: Normal rate.   Pulmonary/Chest: Effort normal. No respiratory distress.  Musculoskeletal: Normal range of motion.  Neurological: He is alert and oriented to person, place, and time.  Skin: Skin is warm and dry.  Right ankle abrasion to posterior aspect with active bleeding, tendon intact, no swelling, no sutureable laceration.   Psychiatric: He has a normal mood and affect. His behavior is normal.  Nursing note and vitals reviewed.   ED Course  Procedures (including critical care time) DIAGNOSTIC STUDIES: Oxygen Saturation is 98% on RA, normal by my interpretation.    COORDINATION OF CARE: 11:48 PM-Discussed treatment plan with pt at bedside and pt agreed to plan.     Labs Review Labs Reviewed - No data to display  Imaging Review No results found. I have personally reviewed and evaluated these images and lab results as part of my medical decision-making.   EKG Interpretation None      MDM   Final diagnoses:  None   1. Right ankle abrasion 2. Coagulopathy  Quick-clot applied to wound with appropriate bandaging. Tetanus updated. He is stable for discharge home with prn PCP follow up.   I personally performed the services described in this documentation, which was scribed in my presence. The recorded information has been reviewed and is accurate.       Eric Anis, PA-C 08/05/15 0543  Benjiman Core, MD 08/05/15 [phone]

## 2015-08-05 NOTE — ED Notes (Signed)
Patient was alert, oriented and stable upon discharge. RN went over AVS and patient had no further questions.  

## 2015-08-18 NOTE — Progress Notes (Signed)
Error   This encounter was created in error - please disregard. 

## 2015-08-19 ENCOUNTER — Encounter: Payer: Medicare Other | Admitting: Cardiovascular Disease

## 2015-09-14 ENCOUNTER — Encounter: Payer: Self-pay | Admitting: *Deleted

## 2015-09-15 ENCOUNTER — Ambulatory Visit: Payer: Medicare Other | Admitting: Diagnostic Neuroimaging

## 2015-09-16 ENCOUNTER — Encounter: Payer: Self-pay | Admitting: Diagnostic Neuroimaging

## 2015-11-03 ENCOUNTER — Inpatient Hospital Stay (HOSPITAL_COMMUNITY)
Admission: EM | Admit: 2015-11-03 | Discharge: 2015-11-09 | DRG: 291 | Disposition: A | Discharge status: Home / Self Care | Payer: Non-veteran care | Attending: Family Medicine | Admitting: Family Medicine

## 2015-11-03 ENCOUNTER — Inpatient Hospital Stay (HOSPITAL_COMMUNITY): Payer: Non-veteran care

## 2015-11-03 ENCOUNTER — Encounter (HOSPITAL_COMMUNITY): Payer: Self-pay | Admitting: Emergency Medicine

## 2015-11-03 ENCOUNTER — Emergency Department (HOSPITAL_COMMUNITY): Payer: Non-veteran care

## 2015-11-03 DIAGNOSIS — J449 Chronic obstructive pulmonary disease, unspecified: Secondary | ICD-10-CM | POA: Diagnosis present

## 2015-11-03 DIAGNOSIS — I2510 Atherosclerotic heart disease of native coronary artery without angina pectoris: Secondary | ICD-10-CM | POA: Diagnosis present

## 2015-11-03 DIAGNOSIS — G4733 Obstructive sleep apnea (adult) (pediatric): Secondary | ICD-10-CM | POA: Diagnosis present

## 2015-11-03 DIAGNOSIS — E1122 Type 2 diabetes mellitus with diabetic chronic kidney disease: Secondary | ICD-10-CM | POA: Diagnosis present

## 2015-11-03 DIAGNOSIS — J9600 Acute respiratory failure, unspecified whether with hypoxia or hypercapnia: Secondary | ICD-10-CM | POA: Diagnosis present

## 2015-11-03 DIAGNOSIS — I10 Essential (primary) hypertension: Secondary | ICD-10-CM | POA: Diagnosis present

## 2015-11-03 DIAGNOSIS — I5043 Acute on chronic combined systolic (congestive) and diastolic (congestive) heart failure: Secondary | ICD-10-CM | POA: Diagnosis present

## 2015-11-03 DIAGNOSIS — N179 Acute kidney failure, unspecified: Secondary | ICD-10-CM

## 2015-11-03 DIAGNOSIS — IMO0001 Reserved for inherently not codable concepts without codable children: Secondary | ICD-10-CM | POA: Diagnosis present

## 2015-11-03 DIAGNOSIS — K219 Gastro-esophageal reflux disease without esophagitis: Secondary | ICD-10-CM | POA: Diagnosis present

## 2015-11-03 DIAGNOSIS — I69351 Hemiplegia and hemiparesis following cerebral infarction affecting right dominant side: Secondary | ICD-10-CM | POA: Diagnosis not present

## 2015-11-03 DIAGNOSIS — D539 Nutritional anemia, unspecified: Secondary | ICD-10-CM | POA: Diagnosis present

## 2015-11-03 DIAGNOSIS — Z7902 Long term (current) use of antithrombotics/antiplatelets: Secondary | ICD-10-CM

## 2015-11-03 DIAGNOSIS — I255 Ischemic cardiomyopathy: Secondary | ICD-10-CM | POA: Diagnosis present

## 2015-11-03 DIAGNOSIS — I509 Heart failure, unspecified: Secondary | ICD-10-CM | POA: Diagnosis not present

## 2015-11-03 DIAGNOSIS — N183 Chronic kidney disease, stage 3 unspecified: Secondary | ICD-10-CM | POA: Diagnosis present

## 2015-11-03 DIAGNOSIS — E1121 Type 2 diabetes mellitus with diabetic nephropathy: Secondary | ICD-10-CM | POA: Diagnosis present

## 2015-11-03 DIAGNOSIS — Z87891 Personal history of nicotine dependence: Secondary | ICD-10-CM

## 2015-11-03 DIAGNOSIS — Z7982 Long term (current) use of aspirin: Secondary | ICD-10-CM

## 2015-11-03 DIAGNOSIS — E0590 Thyrotoxicosis, unspecified without thyrotoxic crisis or storm: Secondary | ICD-10-CM | POA: Diagnosis present

## 2015-11-03 DIAGNOSIS — I480 Paroxysmal atrial fibrillation: Secondary | ICD-10-CM | POA: Diagnosis present

## 2015-11-03 DIAGNOSIS — Z951 Presence of aortocoronary bypass graft: Secondary | ICD-10-CM

## 2015-11-03 DIAGNOSIS — Z794 Long term (current) use of insulin: Secondary | ICD-10-CM

## 2015-11-03 DIAGNOSIS — E785 Hyperlipidemia, unspecified: Secondary | ICD-10-CM | POA: Diagnosis present

## 2015-11-03 DIAGNOSIS — Z79899 Other long term (current) drug therapy: Secondary | ICD-10-CM | POA: Diagnosis not present

## 2015-11-03 DIAGNOSIS — Z95810 Presence of automatic (implantable) cardiac defibrillator: Secondary | ICD-10-CM | POA: Diagnosis present

## 2015-11-03 DIAGNOSIS — J9811 Atelectasis: Secondary | ICD-10-CM | POA: Diagnosis present

## 2015-11-03 DIAGNOSIS — N289 Disorder of kidney and ureter, unspecified: Secondary | ICD-10-CM

## 2015-11-03 DIAGNOSIS — I700 Atherosclerosis of aorta: Secondary | ICD-10-CM

## 2015-11-03 DIAGNOSIS — R0602 Shortness of breath: Secondary | ICD-10-CM

## 2015-11-03 DIAGNOSIS — Z8673 Personal history of transient ischemic attack (TIA), and cerebral infarction without residual deficits: Secondary | ICD-10-CM | POA: Diagnosis present

## 2015-11-03 HISTORY — DX: Chronic kidney disease, stage 2 (mild): N18.2

## 2015-11-03 LAB — CBC WITH DIFFERENTIAL/PLATELET
Basophils Absolute: 0 10*3/uL (ref 0.0–0.1)
Basophils Relative: 0 %
Eosinophils Absolute: 0.2 10*3/uL (ref 0.0–0.7)
Eosinophils Relative: 2 %
HCT: 36.3 % — ABNORMAL LOW (ref 39.0–52.0)
HEMOGLOBIN: 11.3 g/dL — AB (ref 13.0–17.0)
LYMPHS ABS: 1.2 10*3/uL (ref 0.7–4.0)
LYMPHS PCT: 16 %
MCH: 32.2 pg (ref 26.0–34.0)
MCHC: 31.1 g/dL (ref 30.0–36.0)
MCV: 103.4 fL — AB (ref 78.0–100.0)
MONOS PCT: 16 %
Monocytes Absolute: 1.1 10*3/uL — ABNORMAL HIGH (ref 0.1–1.0)
NEUTROS PCT: 66 %
Neutro Abs: 4.6 10*3/uL (ref 1.7–7.7)
Platelets: 130 10*3/uL — ABNORMAL LOW (ref 150–400)
RBC: 3.51 MIL/uL — AB (ref 4.22–5.81)
RDW: 17.4 % — ABNORMAL HIGH (ref 11.5–15.5)
WBC: 7.1 10*3/uL (ref 4.0–10.5)

## 2015-11-03 LAB — GLUCOSE, CAPILLARY
GLUCOSE-CAPILLARY: 109 mg/dL — AB (ref 65–99)
GLUCOSE-CAPILLARY: 242 mg/dL — AB (ref 65–99)
GLUCOSE-CAPILLARY: 231 mg/dL — AB (ref 65–99)
Glucose-Capillary: 178 mg/dL — ABNORMAL HIGH (ref 65–99)

## 2015-11-03 LAB — MAGNESIUM: MAGNESIUM: 2.1 mg/dL (ref 1.7–2.4)

## 2015-11-03 LAB — T4, FREE: Free T4: 1.05 ng/dL (ref 0.61–1.12)

## 2015-11-03 LAB — BASIC METABOLIC PANEL
Anion gap: 7 (ref 5–15)
BUN: 33 mg/dL — AB (ref 6–20)
CHLORIDE: 110 mmol/L (ref 101–111)
CO2: 24 mmol/L (ref 22–32)
CREATININE: 1.86 mg/dL — AB (ref 0.61–1.24)
Calcium: 8.6 mg/dL — ABNORMAL LOW (ref 8.9–10.3)
GFR calc Af Amer: 40 mL/min — ABNORMAL LOW (ref >60)
GFR calc non Af Amer: 35 mL/min — ABNORMAL LOW (ref >60)
GLUCOSE: 60 mg/dL — AB (ref 65–99)
POTASSIUM: 4.9 mmol/L (ref 3.5–5.1)
Sodium: 141 mmol/L (ref 135–145)

## 2015-11-03 LAB — RAPID URINE DRUG SCREEN, HOSP PERFORMED
Amphetamines: NOT DETECTED
BARBITURATES: NOT DETECTED
Benzodiazepines: NOT DETECTED
Cocaine: NOT DETECTED
Opiates: NOT DETECTED
Tetrahydrocannabinol: NOT DETECTED

## 2015-11-03 LAB — I-STAT TROPONIN, ED: Troponin i, poc: 0.07 ng/mL (ref 0.00–0.08)

## 2015-11-03 LAB — PROTIME-INR
INR: 1.4 (ref 0.00–1.49)
Prothrombin Time: 17.3 seconds — ABNORMAL HIGH (ref 11.6–15.2)

## 2015-11-03 LAB — APTT: APTT: 35 s (ref 24–37)

## 2015-11-03 LAB — BRAIN NATRIURETIC PEPTIDE: B Natriuretic Peptide: 697.4 pg/mL — ABNORMAL HIGH (ref 0.0–100.0)

## 2015-11-03 LAB — TROPONIN I
TROPONIN I: 0.08 ng/mL — AB (ref <0.031)
TROPONIN I: 0.06 ng/mL — AB (ref <0.031)
TROPONIN I: 0.06 ng/mL — AB (ref <0.031)

## 2015-11-03 LAB — CREATININE, URINE, RANDOM: Creatinine, Urine: 66.29 mg/dL

## 2015-11-03 LAB — TSH: TSH: 5.538 u[IU]/mL — ABNORMAL HIGH (ref 0.350–4.500)

## 2015-11-03 MED ORDER — VITAMIN D 1000 UNITS PO TABS
1000.0000 [IU] | ORAL_TABLET | Freq: Every day | ORAL | Status: DC
  Administered 2015-11-03 – 2015-11-09 (×7): 1000 [IU] via ORAL
  Filled 2015-11-03 (×7): qty 1

## 2015-11-03 MED ORDER — PERFLUTREN LIPID MICROSPHERE
1.0000 mL | INTRAVENOUS | Status: AC | PRN
  Administered 2015-11-03: 3 mL via INTRAVENOUS
  Filled 2015-11-03: qty 10

## 2015-11-03 MED ORDER — SODIUM CHLORIDE 0.9 % IJ SOLN
3.0000 mL | Freq: Two times a day (BID) | INTRAMUSCULAR | Status: DC
  Administered 2015-11-03 – 2015-11-08 (×13): 3 mL via INTRAVENOUS

## 2015-11-03 MED ORDER — ALBUTEROL (5 MG/ML) CONTINUOUS INHALATION SOLN
15.0000 mg/h | INHALATION_SOLUTION | RESPIRATORY_TRACT | Status: DC
  Filled 2015-11-03: qty 20

## 2015-11-03 MED ORDER — ONDANSETRON HCL 4 MG/2ML IJ SOLN: 4.0000 mg | Freq: Four times a day (QID) | INTRAMUSCULAR | Status: DC | PRN

## 2015-11-03 MED ORDER — AMLODIPINE BESYLATE 5 MG PO TABS: 5.0000 mg | ORAL_TABLET | Freq: Every day | ORAL | Status: DC

## 2015-11-03 MED ORDER — HYDRALAZINE HCL 20 MG/ML IJ SOLN
5.0000 mg | INTRAMUSCULAR | Status: DC | PRN
  Filled 2015-11-03: qty 1

## 2015-11-03 MED ORDER — ACETAMINOPHEN 325 MG PO TABS
650.0000 mg | ORAL_TABLET | ORAL | Status: DC | PRN
  Administered 2015-11-03 – 2015-11-06 (×3): 650 mg via ORAL
  Filled 2015-11-03 (×3): qty 2

## 2015-11-03 MED ORDER — INSULIN ASPART 100 UNIT/ML ~~LOC~~ SOLN
0.0000 [IU] | Freq: Three times a day (TID) | SUBCUTANEOUS | Status: DC
  Administered 2015-11-03 (×2): 3 [IU] via SUBCUTANEOUS
  Administered 2015-11-04 (×2): 2 [IU] via SUBCUTANEOUS
  Administered 2015-11-04: 3 [IU] via SUBCUTANEOUS
  Administered 2015-11-05 (×3): 2 [IU] via SUBCUTANEOUS
  Administered 2015-11-06 – 2015-11-07 (×4): 3 [IU] via SUBCUTANEOUS
  Administered 2015-11-07: 5 [IU] via SUBCUTANEOUS
  Administered 2015-11-07 – 2015-11-08 (×2): 2 [IU] via SUBCUTANEOUS
  Administered 2015-11-08: 7 [IU] via SUBCUTANEOUS
  Administered 2015-11-09: 2 [IU] via SUBCUTANEOUS
  Administered 2015-11-09: 3 [IU] via SUBCUTANEOUS

## 2015-11-03 MED ORDER — CARVEDILOL 3.125 MG PO TABS
3.1250 mg | ORAL_TABLET | Freq: Two times a day (BID) | ORAL | Status: DC
  Administered 2015-11-03 – 2015-11-09 (×14): 3.125 mg via ORAL
  Filled 2015-11-03 (×14): qty 1

## 2015-11-03 MED ORDER — FERROUS SULFATE 325 (65 FE) MG PO TABS
325.0000 mg | ORAL_TABLET | Freq: Three times a day (TID) | ORAL | Status: DC
  Administered 2015-11-03 – 2015-11-09 (×21): 325 mg via ORAL
  Filled 2015-11-03 (×21): qty 1

## 2015-11-03 MED ORDER — PERFLUTREN LIPID MICROSPHERE
INTRAVENOUS | Status: AC
  Filled 2015-11-03: qty 10

## 2015-11-03 MED ORDER — SODIUM CHLORIDE 0.9 % IV SOLN
250.0000 mL | INTRAVENOUS | Status: DC | PRN
Start: 2015-11-03 — End: 2015-11-09

## 2015-11-03 MED ORDER — ALBUTEROL SULFATE (2.5 MG/3ML) 0.083% IN NEBU
5.0000 mg | INHALATION_SOLUTION | Freq: Once | RESPIRATORY_TRACT | Status: AC
  Administered 2015-11-03: 5 mg via RESPIRATORY_TRACT
  Filled 2015-11-03: qty 6

## 2015-11-03 MED ORDER — MAGNESIUM OXIDE 400 (241.3 MG) MG PO TABS
400.0000 mg | ORAL_TABLET | Freq: Every day | ORAL | Status: DC
  Administered 2015-11-03 – 2015-11-09 (×7): 400 mg via ORAL
  Filled 2015-11-03 (×7): qty 1

## 2015-11-03 MED ORDER — FUROSEMIDE 10 MG/ML IJ SOLN
40.0000 mg | Freq: Two times a day (BID) | INTRAMUSCULAR | Status: DC
  Administered 2015-11-03 – 2015-11-07 (×9): 40 mg via INTRAVENOUS
  Filled 2015-11-03 (×9): qty 4

## 2015-11-03 MED ORDER — TRAMADOL HCL 50 MG PO TABS
50.0000 mg | ORAL_TABLET | Freq: Four times a day (QID) | ORAL | Status: DC | PRN
  Administered 2015-11-03 – 2015-11-07 (×2): 50 mg via ORAL
  Filled 2015-11-03 (×2): qty 1

## 2015-11-03 MED ORDER — SODIUM CHLORIDE 0.9 % IJ SOLN
3.0000 mL | INTRAMUSCULAR | Status: DC | PRN
Start: 2015-11-03 — End: 2015-11-09

## 2015-11-03 MED ORDER — METHYLPREDNISOLONE SODIUM SUCC 125 MG IJ SOLR
80.0000 mg | Freq: Once | INTRAMUSCULAR | Status: AC
  Administered 2015-11-03: 80 mg via INTRAVENOUS
  Filled 2015-11-03: qty 2

## 2015-11-03 MED ORDER — ATORVASTATIN CALCIUM 40 MG PO TABS
40.0000 mg | ORAL_TABLET | Freq: Every day | ORAL | Status: DC
  Administered 2015-11-03 – 2015-11-09 (×7): 40 mg via ORAL
  Filled 2015-11-03 (×7): qty 1

## 2015-11-03 MED ORDER — INSULIN ASPART PROT & ASPART (70-30 MIX) 100 UNIT/ML ~~LOC~~ SUSP
30.0000 [IU] | Freq: Two times a day (BID) | SUBCUTANEOUS | Status: DC
  Administered 2015-11-03 – 2015-11-05 (×6): 30 [IU] via SUBCUTANEOUS
  Filled 2015-11-03: qty 10

## 2015-11-03 MED ORDER — HEPARIN SODIUM (PORCINE) 5000 UNIT/ML IJ SOLN
5000.0000 [IU] | Freq: Three times a day (TID) | INTRAMUSCULAR | Status: DC
  Administered 2015-11-03 – 2015-11-06 (×11): 5000 [IU] via SUBCUTANEOUS
  Filled 2015-11-03 (×12): qty 1

## 2015-11-03 MED ORDER — ALBUTEROL SULFATE (2.5 MG/3ML) 0.083% IN NEBU: 2.5000 mg | INHALATION_SOLUTION | RESPIRATORY_TRACT | Status: DC | PRN

## 2015-11-03 MED ORDER — METHIMAZOLE 5 MG PO TABS
5.0000 mg | ORAL_TABLET | Freq: Two times a day (BID) | ORAL | Status: DC
  Administered 2015-11-03 – 2015-11-09 (×14): 5 mg via ORAL
  Filled 2015-11-03 (×15): qty 1

## 2015-11-03 MED ORDER — PANTOPRAZOLE SODIUM 40 MG PO TBEC
40.0000 mg | DELAYED_RELEASE_TABLET | Freq: Every day | ORAL | Status: DC
  Administered 2015-11-03 – 2015-11-09 (×7): 40 mg via ORAL
  Filled 2015-11-03 (×7): qty 1

## 2015-11-03 MED ORDER — CLOPIDOGREL BISULFATE 75 MG PO TABS
75.0000 mg | ORAL_TABLET | Freq: Every day | ORAL | Status: DC
  Administered 2015-11-03 – 2015-11-09 (×7): 75 mg via ORAL
  Filled 2015-11-03 (×7): qty 1

## 2015-11-03 MED ORDER — AMLODIPINE BESYLATE 5 MG PO TABS
5.0000 mg | ORAL_TABLET | Freq: Every day | ORAL | Status: DC
  Administered 2015-11-03 – 2015-11-09 (×7): 5 mg via ORAL
  Filled 2015-11-03 (×8): qty 1

## 2015-11-03 MED ORDER — OXYCODONE HCL 5 MG PO TABS
5.0000 mg | ORAL_TABLET | ORAL | Status: DC | PRN
  Administered 2015-11-05: 5 mg via ORAL
  Filled 2015-11-03: qty 1

## 2015-11-03 MED ORDER — FUROSEMIDE 10 MG/ML IJ SOLN
40.0000 mg | Freq: Once | INTRAMUSCULAR | Status: AC
  Administered 2015-11-03: 40 mg via INTRAVENOUS
  Filled 2015-11-03: qty 4

## 2015-11-03 MED ORDER — ACETAMINOPHEN 325 MG PO TABS: 650.0000 mg | ORAL_TABLET | Freq: Three times a day (TID) | ORAL | Status: DC | PRN

## 2015-11-03 MED ORDER — ASPIRIN 81 MG PO CHEW
81.0000 mg | CHEWABLE_TABLET | Freq: Every day | ORAL | Status: DC
  Administered 2015-11-03 – 2015-11-09 (×7): 81 mg via ORAL
  Filled 2015-11-03 (×7): qty 1

## 2015-11-03 NOTE — ED Notes (Signed)
Pt ambulated to the restroom and back to stretcher. Pt had to stop twice going to the restroom due to being short of breath. O2 sat dropped to 94% going but RR increased to 36. Returning back from the restroom, pt's O2 dropped to 90% and was wheezing.

## 2015-11-03 NOTE — Evaluation (Addendum)
Physical Therapy Evaluation Patient Details Name: Eric Mcguire MRN: [id number] DOB: 01/30/1946 Today's Date: 11/03/2015   History of Present Illness  69 y.o. 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 Dx of CHF exacerbation.  Clinical Impression  Pt is independent with mobility. He independently walked 400' without an assistive device, he took one 2 minute standing rest break 2* SOB. SaO2 93-99% on RA with walking, HR 103. No further PT indicated.     Follow Up Recommendations No PT follow up    Equipment Recommendations  None recommended by PT    Recommendations for Other Services       Precautions / Restrictions Precautions Precautions: None Restrictions Weight Bearing Restrictions: No      Mobility  Bed Mobility Overal bed mobility: Independent                Transfers Overall transfer level: Independent                  Ambulation/Gait Ambulation/Gait assistance: Independent Ambulation Distance (Feet): 400 Feet Assistive device: None Gait Pattern/deviations: WFL(Within Functional Limits)   Gait velocity interpretation: at or above normal speed for age/gender General Gait Details: pt took one standing 2 minute rest break 2* 3/4 dyspnea, SaO2 93-99% on RA walking, HR 103, no LOB. Advised pt he could decrease velocity of walking for energy conservation, he declined to do so.  Stairs            Wheelchair Mobility    Modified Rankin (Stroke Patients Only)       Balance Overall balance assessment: Independent                                           Pertinent Vitals/Pain Pain  Assessment: No/denies pain    Home Living Family/patient expects to be discharged to:: Private residence Living Arrangements: Alone   Type of Home: Apartment Home Access: Level entry     Home Layout: One level Home Equipment: None      Prior Function Level of Independence: Independent               Hand Dominance   Dominant Hand: Right    Extremity/Trunk Assessment   Upper Extremity Assessment: Overall WFL for tasks assessed           Lower Extremity Assessment: Overall WFL for tasks assessed      Cervical / Trunk Assessment: Normal  Communication      Cognition Arousal/Alertness: Awake/alert Behavior During Therapy: WFL for tasks assessed/performed Overall Cognitive Status: Within Functional Limits for tasks assessed                      General Comments      Exercises        Assessment/Plan    PT Assessment Patent does not need any further PT services  PT Diagnosis     PT Problem List    PT Treatment Interventions     PT Goals (Current goals can be found in the Care Plan section) Acute Rehab PT Goals PT Goal Formulation: All assessment and education complete, DC therapy  Frequency     Barriers to discharge        Co-evaluation               End of Session   Activity Tolerance: Patient tolerated treatment well Patient left: in chair;with call bell/phone within reach Nurse Communication: Mobility status         Time: 1610-9604 PT Time Calculation (min) (ACUTE ONLY): 19 min   Charges:   PT Evaluation $Initial PT Evaluation Tier I: 1 Procedure     PT G CodesRalene Bathe Mcguire 11/03/2015, 1:38 PM [phone]

## 2015-11-03 NOTE — ED Notes (Signed)
Patient transported to X-ray 

## 2015-11-03 NOTE — Progress Notes (Signed)
   [facility]                                                                              Patient Demographics  Terance HartDonald Kishimoto, is a 69 y.o. male, DOB - 04/30/1946, [id number]  Admit date - 11/03/2015   Admitting Physician Lorretta HarpXilin Niu, MD  Outpatient Primary MD for the patient is Pcp Not In System  LOS - 0   Chief Complaint  Patient presents with  . Shortness of Breath        Assessment & Plan  Patient admitted earlier today by Dr. Lorretta HarpXilin Niu.  Please see full H&P for details.  Agree with current assessment and plan.  Acute respiratory failure secondary to acute on chronic combined systolic and diastolic heart failure -Echocardiogram 03/07/2015 showed an EF of 25 and 30% with grade 2 diastolic dysfunction -Chest x-ray showed mild left basilar airspace opacity reflecting atelectasis, mild cardiomegaly -Continue IV Lasix, monitor intake and output, daily weights -Breathing and edema do seem to be improving. -Continue Coreg, aspirin -Patient should be on ACE inhibitor however currently has worsening renal function  Acute on chronic kidney disease, stage III -Baseline creatinine 1.2, 1.8 upon admission -Renal ultrasound: Unremarkable -Monitor BMP closely as patient will be diuresing  Mildly elevated troponin -Upon admission, 0.08, continue to cycle -Likely secondary to systolic heart failure along with kidney disease  Hyperthyroidism -Continue methimazole  Coronary artery disease -Status post CABG in 2003 -Currently stable, no complaints of chest pain -Continue statin, Plavix, aspirin, Coreg  Diabetes mellitus, type II -Potassium 11 A1c on 07/07/2015 was 6.6 -Continue insulin sliding scale CBG monitoring. Home insulin dose was decreased to 30 units twice daily  Essential hypertension -ACE inhibitor, Cipro held due to worsening kidney function -Continue Coreg, IV Lasix -Hydralazine as needed -Started on amlodipiine  Obstructive sleep apnea -Continue  CPAP  History of paroxysmal atrial fibrillation -CHADSVASC 6 -Currently not on anticoagulation due to questionable history of bleeding requiring blood transfusion -[facility] -Continue aspirin and Plavix, Coreg  Code Status: Full  Family Communication: None at bedside  Disposition Plan: Admitted  Time Spent in minutes   30 minutes  Procedures  None  Consults   None  DVT Prophylaxis  Heparin  Dasha Kawabata D.O. on 11/03/2015 at 12:23 PM  Between 7am to 7pm - Pager - [phone]  After 7pm go to [email or web address] - password [email or web address]  And look for the night coverage person covering for me after hours  [facility] Office  [phone]

## 2015-11-03 NOTE — ED Notes (Addendum)
Ultrasound called. They requested that the pt remain in the ED for a few minutes so that she could come and do his renal US. 4th floor called and made aware.

## 2015-11-03 NOTE — ED Notes (Signed)
Initially heard audible wheezing. After neb treatment, wheezing decreased

## 2015-11-03 NOTE — ED Notes (Signed)
RN will start IV line and draw blood work 

## 2015-11-03 NOTE — ED Notes (Signed)
Pt states he has had SOB x 3 days. Audible wheezing. Denies hx of asthma or COPD. Alert and oriented.

## 2015-11-03 NOTE — H&P (Signed)
[facility] History and Physical  Eric Mcguire ZOX:[id number] DOB: Mar 31, 1946 DOA: 11/03/2015  Referring physician: ED physician PCP: Pcp Not In System  Specialists:   Chief Complaint: Shortness of breath and worsening leg edema  HPI: Eric Mcguire is a 69 y.o. 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  Patient reports that he has been having worsening shortness of breath and leg edema in the past 3 days. His body weight also increased (cannot tell how much). He has cough, sometimes with brownish colored sputum production per pt. No fever, chills, chest pain. Patient states that sometimes he has mild abdominal pain, but no abdominal pain currently. No diarrhea, symptoms of UTI or new unilateral weakness. [facility] [place]   In ED, patient was found to have a BNP 697.4, negative troponin, WBC 7.1, temperature normal, no tachycardia, tachypnea, worsening renal function. X-ray showed a mild left basilar obesity which is consistent with atelectasis.   Where does patient live?   At home  Can patient participate in ADLs? Some   Review of Systems:   General: no fevers, chills, no changes in body weight, has poor appetite, has fatigue HEENT: no blurry vision, hearing changes or sore throat Pulm: has dyspnea, coughing, no wheezing CV: no chest pain, palpitations Abd: no nausea, vomiting, abdominal pain, diarrhea, constipation GU: no dysuria, burning on urination, increased urinary frequency, hematuria  Ext: has leg edema Neuro: no unilateral weakness, no vision change or hearing loss Skin: no rash MSK: No muscle spasm, no deformity, no  limitation of range of movement in spin Heme: No easy bruising.  Travel history: No recent long distant travel.  Allergy:  Allergies  Allergen Reactions  . Prednisone Other (See Comments)    Stroke     Past Medical History  Diagnosis Date  . Stroke [facility]) Dec 2015    Lt brain, [place]  . Hypertension   . Type 2 diabetes mellitus with renal manifestations (HCC)   . Coronary artery disease   . 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 S/[facility] x 3 [facility] [place]  . PAF (paroxysmal atrial fibrillation) (HCC)     not anticoagulated -hx GI bleed  . Ischemic cardiomyopathy Aug 2016    EF 25%   . Aortic stenosis, severe 07/09/2015  . CKD (chronic kidney disease), stage II     Past Surgical History  Procedure Laterality Date  . Cardiac surgery    . Ep implantable device  July 2013    BS duel ICD  . [place]  . Cardiac catheterization N/A 07/10/2015    Procedure: Right/Left Heart Cath and Coronary Angiography;  Surgeon: Corky Crafts, MD;  Location: [facility] INVASIVE CV LAB;  Service: Cardiovascular;  Laterality: N/A;  . Peripheral vascular catheterization  07/10/2015    Procedure: Thoracic Aortogram;  Surgeon: Corky Crafts, MD;  Location: [facility] INVASIVE CV LAB;  Service: Cardiovascular;;    Social History:  reports that he quit smoking about 20 years ago. His smoking use included Cigarettes. He smoked 1.00 pack per day. He does not have any smokeless tobacco history on file.  He reports that he does not drink alcohol. His drug history is not on file.  Family History:  reviewed, patient does not know much about his family medical history, stating that his parents died of old age.   Prior to Admission medications   Medication Sig Start Date End Date Taking? Authorizing Provider  acetaminophen (TYLENOL 8 HOUR) 650 MG CR tablet Take 1 tablet (650 mg total) by mouth every 8 (eight) hours as needed for  pain. 01/28/15  Yes Kaitlyn Szekalski, PA-C  aspirin 81 MG tablet Take 81 mg by mouth daily.   Yes Historical Provider, MD  atorvastatin (LIPITOR) 80 MG tablet Take 40 mg by mouth daily.    Yes Historical Provider, MD  carvedilol (COREG) 3.125 MG tablet Take 3.125 mg by mouth 2 (two) times daily with a meal.   Yes Historical Provider, MD  cholecalciferol (VITAMIN D) 1000 UNITS tablet Take 1,000 Units by mouth daily.   Yes Historical Provider, MD  clopidogrel (PLAVIX) 75 MG tablet Take 75 mg by mouth daily.   Yes Historical Provider, MD  ferrous sulfate 325 (65 FE) MG tablet Take 1 tablet (325 mg total) by mouth 3 (three) times daily with meals. 05/17/15  Yes Garlon Hatchet, PA-C  furosemide (LASIX) 40 MG tablet Take 1 tablet (40 mg total) by mouth daily. 07/13/15  Yes Zannie Cove, MD  insulin NPH-regular Human (NOVOLIN 70/30) (70-30) 100 UNIT/ML injection Inject 50-55 Units into the skin 2 (two) times daily. 45 units in the am and 40 units at night. 07/13/15  Yes Zannie Cove, MD  lisinopril (PRINIVIL,ZESTRIL) 5 MG tablet Take 1 tablet (5 mg total) by mouth daily. 07/13/15  Yes Zannie Cove, MD  magnesium oxide (MAG-OX) 400 MG tablet Take 400 mg by mouth daily.   Yes Historical Provider, MD  methimazole (TAPAZOLE) 5 MG tablet Take 1 tablet (5 mg total) by mouth 2 (two) times daily. 07/13/15  Yes Zannie Cove, MD  OVER THE COUNTER MEDICATION Take 10 mg by mouth daily as needed (allergies).   Yes Historical Provider, MD  pantoprazole (PROTONIX) 40 MG tablet Take 40 mg by mouth daily.   Yes Historical Provider, MD    Physical Exam: Filed Vitals:   11/03/15 0016 11/03/15 0245 11/03/15 0326  BP: 154/75 144/77   Pulse: 78 78   Temp: 97.5 F (36.4 C)    TempSrc: Oral    Resp: 28 23   Height:  (1.854 m)    Weight: 104.327 kg (230 lb)    SpO2: 93% 97% 92%   General: Not in acute distress HEENT:       Eyes: PERRL, EOMI, no scleral icterus.       ENT: No discharge from the ears and nose,  no pharynx injection, no tonsillar enlargement.        Neck: Difficult to assess JVD due to obesity, no bruit, no mass felt. Heme: No neck lymph node enlargement. Cardiac: S1/S2, RRR, No murmurs, No gallops or rubs. Pulm: slightly decreased air movement bilaterally. No rales, wheezing, rhonchi or rubs. Abd: Soft, distended, nontender, no rebound pain, no organomegaly, BS present. Ext: 2+ pitting leg edema bilaterally. 2+DP/PT pulse bilaterally. Musculoskeletal: No joint deformities, No joint redness or warmth, no limitation of ROM in spin. Skin: No rashes.  Neuro: Alert, oriented X3, cranial nerves II-XII grossly intact, muscle strength 5/5 in all extremities, sensation to light touch intact.  Psych: Patient is not psychotic, no suicidal or hemocidal ideation.  Labs on Admission:  Basic  Metabolic Panel:  Recent Labs Lab 11/03/15 0029  NA 141  K 4.9  CL 110  CO2 24  GLUCOSE 60*  BUN 33*  CREATININE 1.86*  CALCIUM 8.6*   Liver Function Tests: No results for input(s): AST, ALT, ALKPHOS, BILITOT, PROT, ALBUMIN in the last 168 hours. No results for input(s): LIPASE, AMYLASE in the last 168 hours. No results for input(s): AMMONIA in the last 168 hours. CBC:  Recent Labs Lab 11/03/15 0029  WBC 7.1  NEUTROABS 4.6  HGB 11.3*  HCT 36.3*  MCV 103.4*  PLT 130*   Cardiac Enzymes: No results for input(s): CKTOTAL, CKMB, CKMBINDEX, TROPONINI in the last 168 hours.  BNP (last 3 results)  Recent Labs  07/07/15 1011 07/08/15 0447 11/03/15 0029  BNP 670.5* 732.5* 697.4*    ProBNP (last 3 results) No results for input(s): PROBNP in the last 8760 hours.  CBG: No results for input(s): GLUCAP in the last 168 hours.  Radiological Exams on Admission: Dg Chest 2 View  11/03/2015  CLINICAL DATA:  Acute onset of worsening shortness of breath and generalized weakness. Initial encounter. EXAM: CHEST  2 VIEW COMPARISON:  Chest radiograph performed 07/07/2015 FINDINGS: Mild left  basilar opacity likely reflects atelectasis. No pleural effusion or pneumothorax is seen. The cardiomediastinal silhouette is mildly enlarged. The patient is status post median sternotomy. A pacemaker/AICD is noted at the left chest wall, with leads ending at the right atrium and right ventricle. No acute osseous abnormalities are seen. IMPRESSION: Mild left basilar airspace opacity likely reflects atelectasis. Mild cardiomegaly. Electronically Signed   By: Roanna RaiderJeffery  Chang M.D.   On: 11/03/2015 01:06    EKG: Independently reviewed. QTC 467, LAE, T-wave inversion in inferior leads and V5-V6, which does not have significant change compared with previous EKG on 07/07/15.  Assessment/Plan Principal Problem:   Acute on chronic combined systolic and diastolic congestive heart failure (HCC) Active Problems:   S/P CAB[place] 3- 2003 ([place])   Type 2 diabetes with nephropathy (HCC)   [facility]- DEC [facility]   Essential hypertension   Dyslipidemia   Renal insufficiency-stage 3- presumably chronic   ICD in place -BS-(implanted July 2013 [place])   Aortic sclerosis (HCC)   Hyperthyroidism   Acute renal failure superimposed on stage 3 chronic kidney disease (HCC)   CHF exacerbation (HCC)   OSA (obstructive sleep apnea)   PAF (paroxysmal atrial fibrillation) (HCC)   Acute respiratory failure (HCC)   Acute respiratory failure due to acute on chronic combined systolic and diastolic congestive heart failure: Patient's shortness of breath, elevated BNP and worsening leg edema are consistent with congestive heart failure exacerbation. He had 2-D echo on 07/07/15 which showed EF 25-30 percent with grade 2 diastolic dysfunction. Patient is taking Lasix 40 mg daily, but he has increased the body weight and worsening leg edema. He does not have chest pain, no signs of infection for pneumonia.  -will admit to tele bed -Lasix 40 mg was given in ED, will give another dose of lasix 40 mg x 1 now, then 40 mg IV  bid -trop x 3 -2d echo -will continue home coreg ASA -switch lisinopril to amlodipine due to worsening renal function -Daily weights -strict I/O's -Low salt diet -UDS  Hyperthyroidism: Last TSH was 0.037 on 07/07/15, T3 was 2.4 on 07/09/15, T4 was 1.28 on 07/08/15. Patient is taking methimazole at home. WBC 7.1. -Continue home methimazole -Check TSH, free T4, free T3  CAD: s[place] 3- [place]  No chest pain -Continue aspirin, Plavix, Coreg, Lipitor  DM-II: Last A1c 6.6 on 07/07/15, well controled. Patient is taking mixed insulin 70/30 at home. Blood sugars is 60 on admission -will decrease mixing insulin 70/30 from a 45-40 units to 30 units twice a day -SSI  Essential hypertension: -switch lisinopril to amlodipine due to worsening renal function -continue coreg -on lasix IV -IV hydralazine when necessary  HLD: Last LDL was 58 on 07/08/15 -Continue home medications: Lipitor  AoCKD-III: Baseline Cre is 1.2, his Cre is 1.8 and BUN 33 on admission. Likely due to cardiorenal syndrome and continuation of ACEI - Check FeUrea - US-renal - Follow up renal function by BMP - Hold lisinopril  OSA: -CPAP  History of paroxysmal atrial fibrillation: CHA2DS2-VASc Score is 6, needs oral anticoagulation. Pt is not on anticoagulation, reportedly was taken off of it due to questionable bleeding/requiring blood transfusion. -[facility] at [facility] -On ASA and plavix   DVT ppx: SQ Heparin  Code Status: Full code Family Communication: None at bed side. Disposition Plan: Admit to inpatient   Date of Service 11/03/2015    Lorretta Harp [facility] Pager [phone]  If 7PM-7AM, please contact night-coverage [email or web address] Password [email or web address] 11/03/2015, 3:53 AM

## 2015-11-03 NOTE — ED Provider Notes (Signed)
CSN: [id number]     Arrival date & time 11/03/15  0007 History  By signing my name below, I, Eric Mcguire, attest that this documentation has been prepared under the direction and in the presence of Shon Batonourtney F Horton, MD. Electronically Signed: Bethel BornBritney Mcguire, ED Scribe. 11/03/2015. 2:02 AM   Chief Complaint  Patient presents with  . Shortness of Breath   The history is provided by the patient. No language interpreter was used.   Eric Mcguire is a 69 y.o. male with history of COPD, ischemic cardiomyopathy, CAD s/p CABG, a-fib,  HTN, DM, and stroke who presents to the Emergency Department complaining of constant SOB with onset 2 days ago. Laying flat and walking make his breathing worse. His home inhalers variably provide mild relief in SOB.  Associated symptoms include new BLE swelling. Pt denies cough, fever, and chest pain. Pt states that his right arm is always more swollen than his right arm since his stroke.  He no longer uses tobacco. Denies CP, cough, fever.  Past Medical History  Diagnosis Date  . Stroke [facility](HCC) Dec 2015    [place]  . Hypertension   . Type 2 diabetes mellitus with renal manifestations (HCC)   . Coronary artery disease   . 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 [facility] [place]  [place] PAF (paroxysmal atrial fibrillation) (HCC)     not anticoagulated -hx GI bleed  . Ischemic cardiomyopathy Aug 2016    EF 25%   . Aortic stenosis, severe 07/09/2015   Past Surgical History  Procedure Laterality Date  . Cardiac surgery    . Ep implantable device  July 2013    BS duel ICD  . [place]  Dec [place]  . Cardiac catheterization N/A 07/10/2015    Procedure: Right/Left Heart Cath and Coronary Angiography;  Surgeon: Corky CraftsJayadeep S Varanasi, MD;  [facility];  Service: Cardiovascular;  Laterality: N/A;  . Peripheral vascular catheterization  07/10/2015    Procedure:  Thoracic Aortogram;  Surgeon: Corky CraftsJayadeep S Varanasi, MD;  [facility];  Service: Cardiovascular;;   History reviewed. No pertinent family history. Social History  Substance Use Topics  . Smoking status: Former Smoker -- 1.00 packs/day    Types: Cigarettes    Quit date: 12/04/1994  . Smokeless tobacco: None  . Alcohol Use: No    Review of Systems  Constitutional: Negative for fever.  Respiratory: Positive for shortness of breath. Negative for cough.   Cardiovascular: Positive for leg swelling. Negative for chest pain.  Skin: Negative for rash.  All other systems reviewed and are negative.   Allergies  Prednisone  Home Medications   Prior to Admission medications   Medication Sig Start Date End Date Taking? Authorizing Provider  acetaminophen (TYLENOL 8 HOUR) 650 MG CR tablet Take 1 tablet (650 mg total) by mouth every 8 (eight) hours as needed for pain. 01/28/15  Yes Kaitlyn Szekalski, PA-C  aspirin 81 MG tablet Take 81 mg by mouth daily.   Yes Historical Provider, MD  atorvastatin (LIPITOR) 80 MG tablet Take 40 mg by mouth daily.    Yes Historical Provider, MD  carvedilol (COREG) 3.125 MG tablet Take 3.125 mg by mouth 2 (two) times daily with a meal.   Yes Historical Provider, MD  cholecalciferol (VITAMIN D) 1000 UNITS tablet Take 1,000 Units by mouth daily.  Yes Historical Provider, MD  clopidogrel (PLAVIX) 75 MG tablet Take 75 mg by mouth daily.   Yes Historical Provider, MD  ferrous sulfate 325 (65 FE) MG tablet Take 1 tablet (325 mg total) by mouth 3 (three) times daily with meals. 05/17/15  Yes Garlon Hatchet, PA-C  furosemide (LASIX) 40 MG tablet Take 1 tablet (40 mg total) by mouth daily. 07/13/15  Yes Zannie Cove, MD  insulin NPH-regular Human (NOVOLIN 70/30) (70-30) 100 UNIT/ML injection Inject 50-55 Units into the skin 2 (two) times daily. 45 units in the am and 40 units at night. 07/13/15  Yes Zannie Cove, MD  lisinopril (PRINIVIL,ZESTRIL) 5 MG tablet  Take 1 tablet (5 mg total) by mouth daily. 07/13/15  Yes Zannie Cove, MD  magnesium oxide (MAG-OX) 400 MG tablet Take 400 mg by mouth daily.   Yes Historical Provider, MD  methimazole (TAPAZOLE) 5 MG tablet Take 1 tablet (5 mg total) by mouth 2 (two) times daily. 07/13/15  Yes Zannie Cove, MD  OVER THE COUNTER MEDICATION Take 10 mg by mouth daily as needed (allergies).   Yes Historical Provider, MD  pantoprazole (PROTONIX) 40 MG tablet Take 40 mg by mouth daily.   Yes Historical Provider, MD   BP 154/75 mmHg  Pulse 78  Temp(Src) 97.5 F (36.4 C) (Oral)  Resp 28  Ht  (1.854 m)  Wt 230 lb (104.327 kg)  BMI 30.35 kg/m2  SpO2 93%  Physical Exam  Constitutional: He is oriented to person, place, and time.  Mild tachypnea, speaking in full sentences  HENT:  Head: Normocephalic and atraumatic.  Cardiovascular: Normal rate, regular rhythm and normal heart sounds.   No murmur heard. Pulmonary/Chest: Effort normal. No respiratory distress. He has wheezes.  Patient tight with fair air movement  Abdominal: Soft. Bowel sounds are normal. He exhibits distension. There is no tenderness. There is no rebound.  Musculoskeletal: He exhibits edema.  1+ lower extremity edema, pitting  Neurological: He is alert and oriented to person, place, and time.  Skin: Skin is warm and dry.  Psychiatric: He has a normal mood and affect.  Nursing note and vitals reviewed.   ED Course  Procedures (including critical care time)  DIAGNOSTIC STUDIES: Oxygen Saturation is 93% on RA  COORDINATION OF CARE: 12:31 AM Discussed treatment plan which includes lab work, CXR, EKG, a breathing treatment, and Solu-medrol with pt at bedside and pt agreed to plan.  Labs Review Labs Reviewed  CBC WITH DIFFERENTIAL/PLATELET - Abnormal; Notable for the following:    RBC 3.51 (*)    Hemoglobin 11.3 (*)    HCT 36.3 (*)    MCV 103.4 (*)    RDW 17.4 (*)    Platelets 130 (*)    Monocytes Absolute 1.1 (*)    All  other components within normal limits  BASIC METABOLIC PANEL - Abnormal; Notable for the following:    Glucose, Bld 60 (*)    BUN 33 (*)    Creatinine, Ser 1.86 (*)    Calcium 8.6 (*)    GFR calc non Af Amer 35 (*)    GFR calc Af Amer 40 (*)    All other components within normal limits  BRAIN NATRIURETIC PEPTIDE - Abnormal; Notable for the following:    B Natriuretic Peptide 697.4 (*)    All other components within normal limits  I-STAT TROPOININ, ED    Imaging Review Dg Chest 2 View  11/03/2015  CLINICAL DATA:  Acute onset of worsening shortness  of breath and generalized weakness. Initial encounter. EXAM: CHEST  2 VIEW COMPARISON:  Chest radiograph performed 07/07/2015 FINDINGS: Mild left basilar opacity likely reflects atelectasis. No pleural effusion or pneumothorax is seen. The cardiomediastinal silhouette is mildly enlarged. The patient is status post median sternotomy. A pacemaker/AICD is noted at the left chest wall, with leads ending at the right atrium and right ventricle. No acute osseous abnormalities are seen. IMPRESSION: Mild left basilar airspace opacity likely reflects atelectasis. Mild cardiomegaly. Electronically Signed   By: Roanna Raider M.D.   On: 11/03/2015 01:06   I have personally reviewed and evaluated these images and lab results as part of my medical decision-making.   EKG Interpretation   Date/Time:  Tuesday November 03 2015 00:22:26 EST Ventricular Rate:  74 PR Interval:  168 QRS Duration: 101 QT Interval:  421 QTC Calculation: 467 R Axis:   82 Text Interpretation:  Sinus rhythm Atrial premature complex Borderline  right axis deviation Consider anterior infarct Nonspecific T  abnormalities, inferior leads No significant change since last tracing  Confirmed by HORTON  MD, COURTNEY ([id number]) on 11/03/2015 1:04:58 AM      MDM   Final diagnoses:  Shortness of breath  Acute kidney injury [facility])    Patient presents with shortness of breath.  Likely  multifactorial.  Given orthopnea and lower extremity edema, would favor CHF given his known ischemic cardiomyopathy and EF of 25%. He is nontoxic. He was given a duo neb and steroids given self-reported history of COPD. He reports mild improvement after duo neb. Chest x-ray shows no overt volume overload but does show some cardiomegaly.  BNP is 697. Other basic lab work is notable for a creatinine of 1.8.  Baseline creatinine is 1.2. Patient is on daily Lasix. My suspicion is that his shortness of breath is likely related to mild volume overload; however, given improvement with neb, bronchospasm may also be contributing. Patient ambulated and became tachypnea can drop his oxygen saturations to 90. He is not on home oxygen. Given his acute kidney injury, he will need close monitoring with diuresis. Patient was given 1 dose of 40 mg IV Lasix. Will admit for further diuresis and monitoring.  I personally performed the services described in this documentation, which was scribed in my presence. The recorded information has been reviewed and is accurate.    Shon Baton, MD 11/03/15 [phone]

## 2015-11-03 NOTE — Progress Notes (Signed)
Echocardiogram 2D Echocardiogram with Definity has been performed.  Nolon RodBrown, Tony 11/03/2015, 2:05 PM

## 2015-11-03 NOTE — ED Notes (Signed)
Notified Amy, RT about neb treatment.

## 2015-11-04 LAB — GLUCOSE, CAPILLARY
GLUCOSE-CAPILLARY: 177 mg/dL — AB (ref 65–99)
Glucose-Capillary: 168 mg/dL — ABNORMAL HIGH (ref 65–99)
Glucose-Capillary: 226 mg/dL — ABNORMAL HIGH (ref 65–99)
Glucose-Capillary: 182 mg/dL — ABNORMAL HIGH (ref 65–99)

## 2015-11-04 LAB — BASIC METABOLIC PANEL
ANION GAP: 7 (ref 5–15)
BUN: 33 mg/dL — AB (ref 6–20)
CHLORIDE: 101 mmol/L (ref 101–111)
CO2: 28 mmol/L (ref 22–32)
CREATININE: 1.65 mg/dL — AB (ref 0.61–1.24)
Calcium: 9.2 mg/dL (ref 8.9–10.3)
GFR calc non Af Amer: 40 mL/min — ABNORMAL LOW (ref >60)
GFR, EST AFRICAN AMERICAN: 47 mL/min — AB (ref >60)
Glucose, Bld: 147 mg/dL — ABNORMAL HIGH (ref 65–99)
POTASSIUM: 4.8 mmol/L (ref 3.5–5.1)
SODIUM: 136 mmol/L (ref 135–145)

## 2015-11-04 LAB — UREA NITROGEN, URINE: Urea Nitrogen, Ur: 613 mg/dL

## 2015-11-04 LAB — T3, FREE: T3, Free: 2.1 pg/mL (ref 2.0–4.4)

## 2015-11-04 LAB — MAGNESIUM: Magnesium: 1.8 mg/dL (ref 1.7–2.4)

## 2015-11-04 NOTE — Progress Notes (Signed)
PT Cancellation/Screen Note  Patient Details Name: Eric HartDonald Mcguire MRN: [id number] DOB: 09/23/1946   Cancelled Treatment:    Reason Eval/Treat Not Completed: PT screened, no needs identified, will sign off. Please see evaluation PT evaluation from 11/03/15. Will sign off. Thanks.    [name], MPT Pager: [phone]

## 2015-11-04 NOTE — Progress Notes (Signed)
   [facility]                                                                             [id number] ? 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 Component of aortic stenosis however at last cardiac cath 06/2015 deemed no need for further workup -Chest x-ray showed mild left basilar airspace opacity reflecting atelectasis, mild cardiomegaly -Continue IV Lasix 40 q 12, home dose is 40 daily, -5.77 liters, daily weights 262 on admit-->255 -Breathing and edema do seem to be improving. -Continue Coreg, aspirin -Patient should be on ACE inhibitor however currently has worsening renal function -Will need education regarding strict I's and O's, fluid restriction and low-salt  Acute on chronic kidney disease, stage III -Baseline creatinine 1.2, 1.8 upon admission-->1.65 -Renal ultrasound: Unremarkable -Monitor BMP closely as patient will be diuresing  Mildly elevated troponin -Upon admission, 0.08, continue to cycle -Likely secondary to systolic heart failure along with kidney disease  Hyperthyroidism -Continue methimazole  Coronary artery disease -Status post CABG in 2003 -Currently stable, no complaints of chest pain -Continue statin, Plavix, aspirin, Coreg  Diabetes mellitus, type II -Potassium 11 A1c on 07/07/2015 was 6.6 -CBG 168-178 on monitoring. Home insulin dose was decreased to 30 units twice daily  Essential hypertension -ACE inhibitor, Cipro held due to worsening kidney function -Continue Coreg, IV Lasix -Hydralazine as needed -Started on amlodipiine  Obstructive sleep apnea -Continue CPAP  History of paroxysmal atrial fibrillation -CHADSVASC  6 -Currently not on anticoagulation due to questionable history of bleeding requiring blood transfusion -[facility] -Continue aspirin and Plavix, Coreg  Code Status: Full  Family Communication: No family available at bedside  Disposition Plan: Admitted  Time Spent in minutes   25 minutes  Procedures  None  Consults   None  DVT Prophylaxis  Heparin    Pleas KochJai Sianni Cloninger, MD [facility] [phone]

## 2015-11-04 NOTE — Evaluation (Signed)
  Occupational Therapy Evaluation Patient Details Name: Eric Mcguire MRN: [id number] DOB: 11/26/1946 Today's Date: 11/04/2015    History of Present Illness 69 y.o. 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   Clinical Impression   Patient admitted with above. Patient independent to mod I PTA. Patient currently functioning at baseline.  No additional OT needs identified, D/C from acute OT services and no additional follow-up OT needs at this time. All appropriate education provided to patient. Please re-order OT if needed.     Follow Up Recommendations  No OT follow up    Equipment Recommendations  None recommended by OT    Recommendations for Other Services  None known at this time   Precautions / Restrictions Precautions Precautions: Fall (moderate fall risk) Restrictions Weight Bearing Restrictions: No    Mobility Bed Mobility Overal bed mobility: Independent  Transfers Overall transfer level: Independent    Balance Overall balance assessment: Independent    ADL Overall ADL's : At baseline;Independent;Modified independent General ADL Comments: Pt states he uses a shower seat during showers    Pertinent Vitals/Pain Pain Assessment: No/denies pain     Hand Dominance Right   Extremity/Trunk Assessment Upper Extremity Assessment Upper Extremity Assessment: Overall WFL for tasks assessed   Lower Extremity Assessment Lower Extremity Assessment: Overall WFL for tasks assessed   Cervical / Trunk Assessment Cervical / Trunk Assessment: Normal   Communication Communication Communication: No difficulties   Cognition  Arousal/Alertness: Lethargic Behavior During Therapy: WFL for tasks assessed/performed Overall Cognitive Status: Within Functional Limits for tasks assessed              Home Living Family/patient expects to be discharged to:: Private residence Living Arrangements: Alone Available Help at Discharge: Family;Available PRN/intermittently (niece checks in on patient daily) Type of Home: Apartment Home Access: Level entry     Home Layout: One level     Bathroom Shower/Tub: Walk-in shower;Door   Foot LockerBathroom Toilet: Standard     Home Equipment: Shower seat    Prior Functioning/Environment Level of Independence: Independent     OT Diagnosis: Generalized weakness   OT Problem List:  n/a, no acute OT needs identified   OT Treatment/Interventions:   n/a, no acute OT needs identified    OT Goals(Current goals can be found in the care plan section) Acute Rehab OT Goals Patient Stated Goal: "i want to sleep" OT Goal Formulation: All assessment and education complete, DC therapy  OT Frequency:  n/a, no acute OT needs identified    Barriers to D/C:  None known at this time    End of Session Nurse Communication: Other (comment) (make sure patient can have ice-cream)  Activity Tolerance: Patient tolerated treatment well Patient left: in chair;with call bell/phone within reach   Time: [id number] OT Time Calculation (min): 9 min Charges:  OT General Charges $OT Visit: 1 Procedure OT Evaluation $Initial OT Evaluation Tier I: 1 Procedure  Kert Shackett , MS, OTR/L, CLT Pager: [phone]  11/04/2015, 2:16 PM

## 2015-11-05 LAB — BASIC METABOLIC PANEL
Anion gap: 7 (ref 5–15)
BUN: 35 mg/dL — ABNORMAL HIGH (ref 6–20)
CALCIUM: 9.7 mg/dL (ref 8.9–10.3)
CO2: 33 mmol/L — ABNORMAL HIGH (ref 22–32)
CREATININE: 1.5 mg/dL — AB (ref 0.61–1.24)
Chloride: 101 mmol/L (ref 101–111)
GFR, EST AFRICAN AMERICAN: 52 mL/min — AB (ref >60)
GFR, EST NON AFRICAN AMERICAN: 45 mL/min — AB (ref >60)
Glucose, Bld: 127 mg/dL — ABNORMAL HIGH (ref 65–99)
Potassium: 4.4 mmol/L (ref 3.5–5.1)
SODIUM: 141 mmol/L (ref 135–145)

## 2015-11-05 LAB — GLUCOSE, CAPILLARY
GLUCOSE-CAPILLARY: 180 mg/dL — AB (ref 65–99)
Glucose-Capillary: 182 mg/dL — ABNORMAL HIGH (ref 65–99)
Glucose-Capillary: 153 mg/dL — ABNORMAL HIGH (ref 65–99)
Glucose-Capillary: 205 mg/dL — ABNORMAL HIGH (ref 65–99)

## 2015-11-05 LAB — MAGNESIUM: MAGNESIUM: 2 mg/dL (ref 1.7–2.4)

## 2015-11-05 NOTE — Progress Notes (Signed)
Appointment for CHF Clinic made for 11/18/15 at 2:40 PM with Dr. Gala RomneyBensimhon. Pt is aware of appointment and information given to pt.

## 2015-11-05 NOTE — Progress Notes (Signed)
Inpatient Diabetes Program Recommendations  AACE/ADA: New Consensus Statement on Inpatient Glycemic Control (2015)  Target Ranges:  Prepandial:   less than 140 mg/dL      Peak postprandial:   less than 180 mg/dL (1-2 hours)      Critically ill patients:  140 - 180 mg/dL   Results for Eric Mcguire, Eric Mcguire (MRN [id number]) as of 11/05/2015 09:38  Ref. Range 11/04/2015 07:25 11/04/2015 11:55 11/04/2015 16:58 11/04/2015 20:42  Glucose-Capillary Latest Ref Range: 65-99 mg/dL 086168 (H) 578226 (H) 469182 (H) 177 (H)    Results for Eric Mcguire, Eric Mcguire (MRN [id number]) as of 11/05/2015 09:38  Ref. Range 11/05/2015 07:35  Glucose-Capillary Latest Ref Range: 65-99 mg/dL 244180 (H)    Admit CHF Flare  History: DM, CHF, CVA  Home DM Meds: 70/30 insulin- 45 units AM/ 40 units PM  Current Insulin Orders: 70/30 insulin- 30 units bidwc      Novolog Sensitive SSI (0-9 units) TID AC     MD- Patient eating 100% of meals per documentation.  Glucose levels slightly elevated.    Please consider increasing 70/30 insulin to 35 units bidwc      --Will follow patient during hospitalization--  [name] RN, MSN, CDE Diabetes Coordinator Inpatient Glycemic Control Team Team Pager: [phone] (8a-5p)

## 2015-11-05 NOTE — Progress Notes (Signed)
[facility]                                                                             [address]71 ? 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 Component of aortic stenosis however at last cardiac cath 06/2015 deemed no need for further workup -Chest x-ray showed mild left basilar airspace opacity reflecting atelectasis, mild cardiomegaly -Continue IV Lasix 40 q 12, home dose is 40 daily, -5.77 liters, daily weights 262 on admit-->255 - Weight change: -5.352 kg (-11 lb 12.8 oz)  -basleine weight 215.  Will need to be closer to that prior to consideration d/c home -Breathing and edema do seem to be improving. -Continue Coreg, aspirin -Patient should be on ACE inhibitor however currently has worsening renal function -Will need education regarding strict I's and O's, fluid restriction and low-salt -ambulate  Acute on chronic kidney disease, stage III -Baseline creatinine 1.2, 1.8 upon admission-->1.65-->1.5 -Renal ultrasound: Unremarkable -Monitor BMP closely as patient will be diuresing  Mildly elevated troponin -Upon admission, 0.08.  Stop cylcine, not ACS -Likely secondary to systolic heart failure along with kidney disease  Hyperthyroidism -Continue methimazole  Coronary artery disease -Status post CABG in 2003 -Currently stable, no complaints of chest pain -Continue statin, Plavix, aspirin, Coreg  Diabetes mellitus, type II -Potassium 11 A1c on 07/07/2015 was 6.6 -CBG 168-178 on monitoring. Home insulin dose was decreased to 30 units twice daily  Essential hypertension -ACE inhibitor, Cipro held due to worsening kidney function -Continue Coreg, IV  Lasix -Hydralazine as needed -Started on amlodipiine  Obstructive sleep apnea -Continue CPAP  History of paroxysmal atrial fibrillation -CHADSVASC 6 -Currently not on anticoagulation due to questionable history of bleeding requiring blood transfusion -[facility] -Continue aspirin and Plavix, Coreg  Mild TCP PLT in 125-130 range Needs OP follow up OK for now continue ASA + Plavix  Macrocytic anemia Monitor  Code Status: Full  Family Communication: No family available at bedside  Disposition Plan: Admitted  CBC Latest Ref Rng 11/03/2015 07/13/2015 07/12/2015  WBC 4.0 - 10.5 K/uL 7.1 5.2 4.7  Hemoglobin 13.0 - 17.0 g/dL 11.3(L) 13.1 12.7(L)  Hematocrit 39.0 - 52.0 % 36.3(L) 41.2 40.1  Platelets 150 - 400 K/uL 130(L) 130(L) 143(L)    BMET    Component Value Date/Time   NA 141 11/05/2015 0421   K 4.4 11/05/2015 0421   CL 101 11/05/2015 0421   CO2 33* 11/05/2015 0421   GLUCOSE 127* 11/05/2015 0421   BUN 35* 11/05/2015 0421   CREATININE 1.50* 11/05/2015 0421   CALCIUM 9.7 11/05/2015 0421   GFRNONAA 45* 11/05/2015 0421   GFRAA 52* 11/05/2015 0421  Weight change: -5.352 kg (-11 lb 12.8 oz)     Time Spent in minutes   25 minutes  Procedures  None  Consults   None  DVT Prophylaxis  Heparin    Pleas Koch, MD [facility] [phone]

## 2015-11-05 NOTE — Progress Notes (Signed)
Pt prefers self placement.  RT to monitor and assess as needed.

## 2015-11-06 LAB — BASIC METABOLIC PANEL
ANION GAP: 8 (ref 5–15)
BUN: 32 mg/dL — ABNORMAL HIGH (ref 6–20)
CALCIUM: 9.6 mg/dL (ref 8.9–10.3)
CHLORIDE: 94 mmol/L — AB (ref 101–111)
CO2: 33 mmol/L — ABNORMAL HIGH (ref 22–32)
CREATININE: 1.55 mg/dL — AB (ref 0.61–1.24)
GFR calc non Af Amer: 43 mL/min — ABNORMAL LOW (ref >60)
GFR, EST AFRICAN AMERICAN: 50 mL/min — AB (ref >60)
Glucose, Bld: 202 mg/dL — ABNORMAL HIGH (ref 65–99)
Potassium: 4.2 mmol/L (ref 3.5–5.1)
SODIUM: 135 mmol/L (ref 135–145)

## 2015-11-06 LAB — MAGNESIUM: MAGNESIUM: 1.8 mg/dL (ref 1.7–2.4)

## 2015-11-06 LAB — GLUCOSE, CAPILLARY
GLUCOSE-CAPILLARY: 225 mg/dL — AB (ref 65–99)
Glucose-Capillary: 226 mg/dL — ABNORMAL HIGH (ref 65–99)
Glucose-Capillary: 222 mg/dL — ABNORMAL HIGH (ref 65–99)
Glucose-Capillary: 158 mg/dL — ABNORMAL HIGH (ref 65–99)

## 2015-11-06 MED ORDER — INSULIN ASPART PROT & ASPART (70-30 MIX) 100 UNIT/ML ~~LOC~~ SUSP
35.0000 [IU] | Freq: Two times a day (BID) | SUBCUTANEOUS | Status: DC
  Administered 2015-11-06 – 2015-11-09 (×8): 35 [IU] via SUBCUTANEOUS
  Filled 2015-11-06: qty 10

## 2015-11-06 NOTE — Progress Notes (Signed)
Patient states he places himself on and off of CPAP when ready. RT informed patient if he needs any help have RN contact RT. 

## 2015-11-06 NOTE — Care Management Important Message (Signed)
Important Message  Patient Details  Name: Eric HartDonald Mcguire MRN: [id number] Date of Birth: 05/16/1946   Medicare Important Message Given:  Yes    Haskell FlirtJamison, Nekhi Liwanag 11/06/2015, 12:07 PMImportant Message  Patient Details  Name: Eric HartDonald Mcguire MRN: [id number] Date of Birth: 11/09/1946   Medicare Important Message Given:  Yes    Haskell FlirtJamison, Cicley Ganesh 11/06/2015, 12:07 PM

## 2015-11-06 NOTE — Progress Notes (Signed)
[facility]                                                                             [id number] ? 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 Component of aortic stenosis however at last cardiac cath 06/2015 deemed no need for further workup -Chest x-ray showed mild left basilar airspace opacity reflecting atelectasis, mild cardiomegaly -Continue IV Lasix 40 q 12, home dose is 40 daily, -5.77 liters, daily weights 262 on admit-->255-->237 - Weight change: -2.812 kg (-6 lb 3.2 oz) since 12/8 -basleine weight 215.  Will need to be closer to that prior to consideration d/c home -Breathing and edema continue improving. -Continue Coreg, aspirin -Patient should be on ACE inhibitor however currently has worsening renal function -Will need education regarding strict I's and O's, fluid restriction and low-salt -ambulate  Acute on chronic kidney disease, stage III -Baseline creatinine 1.2, 1.8 upon admission-->1.65-->1.5 and stable -Renal ultrasound: Unremarkable -Monitor BMP closely as patient will be diuresing  Mildly elevated troponin -Upon admission, 0.08.  Stop cylcing, not ACS -Likely secondary to systolic heart failure along with kidney disease  Hyperthyroidism -Continue methimazole  Coronary artery disease -Status post CABG in 2003 -Currently stable, no complaints of chest pain -Continue statin, Plavix, aspirin, Coreg  Diabetes mellitus, type II -Potassium 11 A1c on 07/07/2015 was 6.6 -CBG 226 on monitoring. Home insulin dose was inc to 35 units twice daily 12/8  Essential hypertension -ACE inhibitor, Cipro held due to worsening kidney function -Continue Coreg, IV  Lasix -Hydralazine as needed -Started on amlodipiine  Obstructive sleep apnea -Continue CPAP  History of paroxysmal atrial fibrillation -CHADSVASC 6 -Currently not on anticoagulation due to questionable history of bleeding requiring blood transfusion -[facility] -Continue aspirin and Plavix, Coreg  Mild TCP PLT in 125-130 range Needs OP follow up OK for now continue ASA + Plavix  Macrocytic anemia Monitor  Code Status: Full  Family Communication: No family available at bedside  Disposition Plan: Admitted  CBC Latest Ref Rng 11/03/2015 07/13/2015 07/12/2015  WBC 4.0 - 10.5 K/uL 7.1 5.2 4.7  Hemoglobin 13.0 - 17.0 g/dL 11.3(L) 13.1 12.7(L)  Hematocrit 39.0 - 52.0 % 36.3(L) 41.2 40.1  Platelets 150 - 400 K/uL 130(L) 130(L) 143(L)    BMET    Component Value Date/Time   NA 135 11/06/2015 0452   K 4.2 11/06/2015 0452   CL 94* 11/06/2015 0452   CO2 33* 11/06/2015 0452   GLUCOSE 202* 11/06/2015 0452   BUN 32* 11/06/2015 0452   CREATININE 1.55* 11/06/2015 0452   CALCIUM 9.6 11/06/2015 0452   GFRNONAA 43* 11/06/2015 0452   GFRAA 50* 11/06/2015 0452  Weight change: -2.812 kg (-6 lb 3.2 oz)     Time Spent in minutes   25 minutes  Procedures  None  Consults   None  DVT Prophylaxis  Heparin    Pleas Koch, MD [facility] [phone]

## 2015-11-07 LAB — GLUCOSE, CAPILLARY
GLUCOSE-CAPILLARY: 265 mg/dL — AB (ref 65–99)
Glucose-Capillary: 236 mg/dL — ABNORMAL HIGH (ref 65–99)
Glucose-Capillary: 168 mg/dL — ABNORMAL HIGH (ref 65–99)
Glucose-Capillary: 214 mg/dL — ABNORMAL HIGH (ref 65–99)

## 2015-11-07 LAB — MAGNESIUM: Magnesium: 1.9 mg/dL (ref 1.7–2.4)

## 2015-11-07 MED ORDER — FUROSEMIDE 40 MG PO TABS
40.0000 mg | ORAL_TABLET | Freq: Two times a day (BID) | ORAL | Status: DC
  Administered 2015-11-07 – 2015-11-09 (×6): 40 mg via ORAL
  Filled 2015-11-07 (×6): qty 1

## 2015-11-07 NOTE — Progress Notes (Signed)
Patient explained that he has tried "everything" to moisturize his dry feet.  RN offered lotion but patient refused, stating it "wouldn't work".  RN informed Dr. Mahala MenghiniSamtani to possibly make outpatient referral to podiatrist.  No further needs at this time.  Will continue to monitor.  Pt resting comfortably.  Barrie LymeVance, Quiara Killian E RN 1:57 PM 11/07/2015

## 2015-11-07 NOTE — Progress Notes (Signed)
[facility]                                                                             [id number] ? 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  Admitted 12.6.16 c Acute SOB and decompensated HF  Assessment & Plan    Acute respiratory failure secondary to acute on chronic combined systolic and diastolic heart failure -Echocardiogram 11/03/15 shows improvement to 35% from prior echo 07/07/15 -not symptomatic 2/2 aortic stenosis- last cardiac cath 06/2015 deemed no need for further workup -Chest x-ray showed mild left basilar airspace opacity reflecting atelectasis, mild cardiomegaly -Continue IV Lasix 40 q 12, home dose is 40 daily, -5.77 liters, daily weights 262 on admit-->255-->237-->234 - Weight change: -1.315 kg (-2 lb 14.4 oz) since 12/8 -basleine weight 215.  Dry weight adjusted as has gaine dnon-fluid weight since last hopsital stay from dietary indiscretions -Breathing and edema continue improving. -Continue Coreg, aspirin -Patient should be on ACE inhibitor however currently has worsening renal function -Will need education regarding strict I's and O's, fluid restriction and low-salt -ambulate  Acute on chronic kidney disease, stage III -Baseline creatinine 1.2, 1.8 upon admission-->1.65-->1.5.  Get am labs -Renal ultrasound: Unremarkable -Monitor BMP closely as patient will be diuresing  Mildly elevated troponin -Upon admission, 0.08.  Stop cylcing, not ACS -Likely secondary to systolic heart failure along with kidney disease  Hyperthyroidism -Continue methimazole  Coronary artery disease -Status post CABG in 2003 -Currently stable, no complaints of chest pain -Continue statin, Plavix, aspirin, Coreg  Diabetes mellitus, type II -Potassium 11 A1c on 07/07/2015 was 6.6 -CBG 226 on monitoring. Home insulin dose was inc to 35 units twice daily 12/8  Essential hypertension -ACE inhibitor, Cipro held due to  worsening kidney function -Continue Coreg, IV Lasix -Hydralazine as needed -Started on amlodipiine  Obstructive sleep apnea -Continue CPAP  History of paroxysmal atrial fibrillation -CHADSVASC 6 -Currently not on anticoagulation due to questionable history of bleeding requiring blood transfusion -[facility] -Continue aspirin and Plavix, Coreg  Mild TCP PLT in 125-130 range Needs OP follow up OK for now continue ASA + Plavix  Macrocytic anemia Monitor  Code Status: Full  Family Communication: No family available at bedside  Disposition Plan: Admitted  CBC Latest Ref Rng 11/03/2015 07/13/2015 07/12/2015  WBC 4.0 - 10.5 K/uL 7.1 5.2 4.7  Hemoglobin 13.0 - 17.0 g/dL 11.3(L) 13.1 12.7(L)  Hematocrit 39.0 - 52.0 % 36.3(L) 41.2 40.1  Platelets 150 - 400 K/uL 130(L) 130(L) 143(L)    BMET    Component Value Date/Time   NA 135 11/06/2015 0452   K 4.2 11/06/2015 0452   CL 94* 11/06/2015 0452   CO2 33* 11/06/2015 0452   GLUCOSE 202* 11/06/2015 0452   BUN 32* 11/06/2015 0452   CREATININE 1.55* 11/06/2015 0452   CALCIUM 9.6 11/06/2015 0452   GFRNONAA 43* 11/06/2015 0452   GFRAA 50* 11/06/2015 0452  Weight change: -1.315 kg (-2 lb 14.4 oz)     Time Spent in minutes   25 minutes  Procedures  None  Consults  None  DVT Prophylaxis  Heparin    Pleas Koch, MD [facility] [phone]

## 2015-11-07 NOTE — Progress Notes (Signed)
RT note: Pt. seen regarding cpap, stated,"does all on own", told to notify RN to call RT if help needed, RT to monitor.

## 2015-11-08 LAB — CBC
HEMATOCRIT: 44.7 % (ref 39.0–52.0)
HEMOGLOBIN: 14.4 g/dL (ref 13.0–17.0)
MCH: 33 pg (ref 26.0–34.0)
MCHC: 32.2 g/dL (ref 30.0–36.0)
MCV: 102.3 fL — AB (ref 78.0–100.0)
Platelets: 161 10*3/uL (ref 150–400)
RBC: 4.37 MIL/uL (ref 4.22–5.81)
RDW: 16.1 % — ABNORMAL HIGH (ref 11.5–15.5)
WBC: 6 10*3/uL (ref 4.0–10.5)

## 2015-11-08 LAB — BASIC METABOLIC PANEL
Anion gap: 8 (ref 5–15)
BUN: 33 mg/dL — ABNORMAL HIGH (ref 6–20)
CHLORIDE: 96 mmol/L — AB (ref 101–111)
CO2: 32 mmol/L (ref 22–32)
Calcium: 9.1 mg/dL (ref 8.9–10.3)
Creatinine, Ser: 1.51 mg/dL — ABNORMAL HIGH (ref 0.61–1.24)
GFR calc non Af Amer: 45 mL/min — ABNORMAL LOW (ref >60)
GFR, EST AFRICAN AMERICAN: 52 mL/min — AB (ref >60)
Glucose, Bld: 121 mg/dL — ABNORMAL HIGH (ref 65–99)
POTASSIUM: 4.4 mmol/L (ref 3.5–5.1)
Sodium: 136 mmol/L (ref 135–145)

## 2015-11-08 LAB — GLUCOSE, CAPILLARY
GLUCOSE-CAPILLARY: 184 mg/dL — AB (ref 65–99)
Glucose-Capillary: 115 mg/dL — ABNORMAL HIGH (ref 65–99)
Glucose-Capillary: 315 mg/dL — ABNORMAL HIGH (ref 65–99)
Glucose-Capillary: 193 mg/dL — ABNORMAL HIGH (ref 65–99)

## 2015-11-08 LAB — MAGNESIUM: Magnesium: 1.8 mg/dL (ref 1.7–2.4)

## 2015-11-08 NOTE — Progress Notes (Signed)
[facility]                                                                             56 ? 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  Admitted 12.6.16 c Acute SOB and decompensated HF  Assessment & Plan    Fair no n/v.cp Continues to diurese but slower No other c/o CP absnet   eomi ncat s1 s2 no m/r/g Has decreased JVD No bruit No le edema  No focal deficit  Acute respiratory failure secondary to acute on chronic combined systolic and diastolic heart failure -Echocardiogram 11/03/15 shows improvement to 35% from prior echo 07/07/15 -not symptomatic 2/2 aortic stenosis- last cardiac cath 06/2015 deemed no need for further workup -Chest x-ray showed mild left basilar airspace opacity reflecting atelectasis, mild cardiomegaly -Continue IV Lasix 40 q 12, home dose is 40 daily, -5.77 liters, daily weights 262 on admit-->255-->237-->234-->233 - Weight change: -0.544 kg (-1 lb 3.2 oz) since 12/8 -basleine weight 215.  Dry weight adjusted as has gained non-fluid weight since last hopsital stay from dietary indiscretions -Breathing and edema continue improving. -Continue Coreg, aspirin -Patient should be on ACE inhibitor however currently has worsening renal function -Will need education regarding strict I's and O's, fluid restriction and low-salt -ambulate  Acute on chronic kidney disease, stage III -Baseline creatinine 1.2, 1.8 upon admission-->1.65-->1.5.   -Renal ultrasound: Unremarkable -Monitor BMP closely as patient will be diuresing  Mildly elevated troponin -Upon admission, 0.08.  Stop cylcing, not ACS -Likely secondary to systolic heart failure along with kidney disease  Hyperthyroidism -Continue methimazole  Coronary artery disease -Status post CABG in 2003 -Currently stable, no complaints of chest pain -Continue statin, Plavix, aspirin, Coreg  Diabetes mellitus, type II -Potassium 11 A1c on  07/07/2015 was 6.6 -CBG 115-315 on monitoring. Home insulin dose was inc to 35 units twice daily 12/8  Essential hypertension -ACE inhibitor, Cipro held due to worsening kidney function -Continue Coreg, IV Lasix -Hydralazine as needed -Started on amlodipiine  Obstructive sleep apnea -Continue CPAP  History of paroxysmal atrial fibrillation -CHADSVASC 6 -Currently not on anticoagulation due to questionable history of bleeding requiring blood transfusion -Patient to continue follow-up with [facility] -Continue aspirin and Plavix, Coreg  Mild TCP PLT in 125-130 range Needs OP follow up OK for now continue ASA + Plavix  Macrocytic anemia Monitor  Code Status: Full  Family Communication: No family available at bedside  Disposition Plan: Admitted  CBC Latest Ref Rng 11/08/2015 11/03/2015 07/13/2015  WBC 4.0 - 10.5 K/uL 6.0 7.1 5.2  Hemoglobin 13.0 - 17.0 g/dL 16.1 11.3(L) 13.1  Hematocrit 39.0 - 52.0 % 44.7 36.3(L) 41.2  Platelets 150 - 400 K/uL 161 130(L) 130(L)    BMET    Component Value Date/Time   NA 136 11/08/2015 0450   K 4.4 11/08/2015 0450   CL 96* 11/08/2015 0450   CO2 32 11/08/2015 0450   GLUCOSE 121* 11/08/2015 0450   BUN 33* 11/08/2015 0450   CREATININE 1.51* 11/08/2015 0450   CALCIUM 9.1 11/08/2015 0450   GFRNONAA 45* 11/08/2015 0450  GFRAA 52* 11/08/2015 0450  Weight change: -0.544 kg (-1 lb 3.2 oz)     Time Spent in minutes  15 minutes  Procedures  None  Consults   None  DVT Prophylaxis  Heparin    Pleas KochJai Amoni Scallan, MD [facility] [phone]) [phone]

## 2015-11-08 NOTE — Progress Notes (Signed)
RT note: Pt. seen, told to notify if help needed with cpap this evening, RT to monitor.

## 2015-11-09 LAB — BASIC METABOLIC PANEL
Anion gap: 7 (ref 5–15)
BUN: 32 mg/dL — AB (ref 6–20)
CO2: 29 mmol/L (ref 22–32)
CREATININE: 1.39 mg/dL — AB (ref 0.61–1.24)
Calcium: 9.2 mg/dL (ref 8.9–10.3)
Chloride: 99 mmol/L — ABNORMAL LOW (ref 101–111)
GFR, EST AFRICAN AMERICAN: 57 mL/min — AB (ref >60)
GFR, EST NON AFRICAN AMERICAN: 49 mL/min — AB (ref >60)
Glucose, Bld: 246 mg/dL — ABNORMAL HIGH (ref 65–99)
POTASSIUM: 4.5 mmol/L (ref 3.5–5.1)
SODIUM: 135 mmol/L (ref 135–145)

## 2015-11-09 LAB — GLUCOSE, CAPILLARY
GLUCOSE-CAPILLARY: 240 mg/dL — AB (ref 65–99)
GLUCOSE-CAPILLARY: 169 mg/dL — AB (ref 65–99)

## 2015-11-09 LAB — MAGNESIUM: MAGNESIUM: 1.8 mg/dL (ref 1.7–2.4)

## 2015-11-09 MED ORDER — AMLODIPINE BESYLATE 5 MG PO TABS: 5.0000 mg | ORAL_TABLET | Freq: Every day | ORAL | Status: AC

## 2015-11-09 MED ORDER — FUROSEMIDE 40 MG PO TABS
40.0000 mg | ORAL_TABLET | Freq: Two times a day (BID) | ORAL | Status: DC
Start: 2015-11-09 — End: 2016-09-06

## 2015-11-09 NOTE — Progress Notes (Signed)
Pharmacist Heart Failure Core Measure Documentation  Assessment: Eric Mcguire has an EF documented as 35% on 11/03/2015 by ECHO.  Rationale: Heart failure patients with left ventricular systolic dysfunction (LVSD) and an EF < 40% should be prescribed an angiotensin converting enzyme inhibitor (ACEI) or angiotensin receptor blocker (ARB) at discharge unless a contraindication is documented in the medical record.  This patient is not currently on an ACEI or ARB for HF.  This note is being placed in the record in order to provide documentation that a contraindication to the use of these agents is present for this encounter.  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  Resume lisinopril when appropriate.  Dannielle HuhZeigler, Dustin George 11/09/2015 2:29 PM

## 2015-11-09 NOTE — Discharge Summary (Signed)
Physician Discharge Summary  Eric Mcguire ZOX:[id number] DOB: April 30, 1946 DOA: 11/03/2015  PCP: Pcp Not In System  Admit date: 11/03/2015 Discharge date: 11/09/2015  Time spent: 30 minutes  Recommendations for Outpatient Follow-up:  1. Needs BMet and cbc 1 week 2. Get TSH in about 1 month as is on Tapazole 3. Needs daily weights, strict fluid restriction and compliance on lasix & low salt diet stressed 4. Consider work-up for TCP as OP and discussion about Coumadin   Discharge Diagnoses:  Principal Problem:   Acute on chronic combined systolic and diastolic congestive heart failure (HCC) Active Problems:   S/P CABG x 3- 2003 ([place])   Type 2 diabetes with nephropathy (HCC)   CVA- DEC 2015 [facility])   Essential hypertension   Dyslipidemia   Renal insufficiency-stage 3- presumably chronic   ICD in place -BS-(implanted July 2013 [place])   Aortic sclerosis (HCC)   Hyperthyroidism   Acute renal failure superimposed on stage 3 chronic kidney disease (HCC)   CHF exacerbation (HCC)   OSA (obstructive sleep apnea)   PAF (paroxysmal atrial fibrillation) (HCC)   Acute respiratory failure (HCC)   Discharge Condition: improved  Diet recommendation:  hh low salt  Filed Weights   11/07/15 0414 11/08/15 0429 11/09/15 0500  Weight: 106.232 kg (234 lb 3.2 oz) 105.688 kg (233 lb) 104.872 kg (231 lb 3.2 oz)    History of present illness:   69 ? 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  Admitted 12.6.16 c Acute SOB and decompensated HF    Acute respiratory failure secondary to acute on chronic combined systolic and diastolic heart failure -Echocardiogram 11/03/15 shows improvement to 35% from prior echo 07/07/15 -not symptomatic 2/2 aortic stenosis- last cardiac cath 06/2015 deemed no need for further workup -Chest x-ray showed mild left basilar airspace opacity reflecting atelectasis, mild cardiomegaly -Continue IV  Lasix 40 q 12, home dose is 40 daily, -5.77 liters, daily weights 262 on admit-->255-->237-->234-->233-->231 on dc -JVD is down, LE's not swollen and ambulatory without oxygen -basleine weight 215. Dry weight adjusted as has gained non-fluid weight since last hopsital stay from dietary indiscretions -Breathing and edema continue improved -Continue Coreg, aspirin -Patient should be on ACE inhibitor however currently has worsening renal function  Acute on chronic kidney disease, stage III -Baseline creatinine 1.2, 1.8 upon admission-->1.65-->1.5--.1.39 on d/c home  -Renal ultrasound: Unremarkable -Monitor BMP closely as patient will be diuresing  Mildly elevated troponin -Upon admission, 0.08. Stop cylcing, not ACS -Likely secondary to systolic heart failure along with kidney disease  Hyperthyroidism -Continue methimazole -TSH in 1 mo for screening  Coronary artery disease -Status post CABG in 2003 -Currently stable, no complaints of chest pain -Continue statin, Plavix, aspirin, Coreg  Diabetes mellitus, type II -HBA1c on 07/07/2015 was 6.6 -CBG 193-246 on monitoring. Home insulin dose was inc to 35 units twice daily 12/8 -he was put back on his home doses of 70/30 insulin 40-45 units on d/c home  Essential hypertension -ACE inhibitor, Cipro held due to worsening kidney function -Continue Coreg -Hydralazine was used while in hospital -Started on amlodipinethis admission  Obstructive sleep apnea -Continue CPAP  History of paroxysmal atrial fibrillation -CHADSVASC 6 -Currently not on anticoagulation due to questionable history of bleeding requiring blood transfusion -Patient to continue follow-up with his cardiologist at [facility] -Continue aspirin and Plavix, Coreg  Mild TCP PLT in 125-130 range Needs OP follow up OK  for now continue ASA + Plavix  Macrocytic anemia Monitor   Discharge Exam: Filed Vitals:   11/08/15 2031 11/09/15 0500  BP: 152/90 131/58   Pulse: 81 72  Temp: 98.1 F (36.7 C) 98.3 F (36.8 C)  Resp: 18 18    General: eomi ncat no distresss Cardiovascular: s1 s 2no m/r/g Respiratory: clear, no added sound  Discharge Instructions   Discharge Instructions    Diet - low sodium heart healthy    Complete by:  As directed      Discharge instructions    Complete by:  As directed   2  Medications will be rx  Amlodipine is for the blood pressure Lasix dose has changed You will need some labwork within 1 weeks-[facility] or at the Primary [facility] office     Increase activity slowly    Complete by:  As directed           Current Discharge Medication List    START taking these medications   Details  amLODipine (NORVASC) 5 MG tablet Take 1 tablet (5 mg total) by mouth daily. Qty: 30 tablet, Refills: 0      CONTINUE these medications which have CHANGED   Details  furosemide (LASIX) 40 MG tablet Take 1 tablet (40 mg total) by mouth 2 (two) times daily. Qty: 30 tablet, Refills: 0      CONTINUE these medications which have NOT CHANGED   Details  acetaminophen (TYLENOL 8 HOUR) 650 MG CR tablet Take 1 tablet (650 mg total) by mouth every 8 (eight) hours as needed for pain. Qty: 30 tablet, Refills: 0    aspirin 81 MG tablet Take 81 mg by mouth daily.    atorvastatin (LIPITOR) 80 MG tablet Take 40 mg by mouth daily.     carvedilol (COREG) 3.125 MG tablet Take 3.125 mg by mouth 2 (two) times daily with a meal.    cholecalciferol (VITAMIN D) 1000 UNITS tablet Take 1,000 Units by mouth daily.    clopidogrel (PLAVIX) 75 MG tablet Take 75 mg by mouth daily.    ferrous sulfate 325 (65 FE) MG tablet Take 1 tablet (325 mg total) by mouth 3 (three) times daily with meals. Qty: 90 tablet, Refills: 0    insulin NPH-regular Human (NOVOLIN 70/30) (70-30) 100 UNIT/ML injection Inject 50-55 Units into the skin 2 (two) times daily. 45 units in the am and 40 units at night.    lisinopril (PRINIVIL,ZESTRIL) 5  MG tablet Take 1 tablet (5 mg total) by mouth daily. Qty: 30 tablet, Refills: 0    magnesium oxide (MAG-OX) 400 MG tablet Take 400 mg by mouth daily.    methimazole (TAPAZOLE) 5 MG tablet Take 1 tablet (5 mg total) by mouth 2 (two) times daily. Qty: 30 tablet, Refills: 0    OVER THE COUNTER MEDICATION Take 10 mg by mouth daily as needed (allergies).    pantoprazole (PROTONIX) 40 MG tablet Take 40 mg by mouth daily.       Allergies  Allergen Reactions  . Prednisone Other (See Comments)    Stroke    Follow-up Information    Follow up with Arvilla MeresBensimhon, Daniel, MD On 11/18/2015.   Specialty:  Cardiology   Why:  appointment at 2:40 PM   Contact information:   [address] [place] [address] [phone]        The results of significant diagnostics from this hospitalization (including imaging, microbiology, ancillary and laboratory) are  listed below for reference.    Significant Diagnostic Studies: Dg Chest 2 View  11/03/2015  CLINICAL DATA:  Acute onset of worsening shortness of breath and generalized weakness. Initial encounter. EXAM: CHEST  2 VIEW COMPARISON:  Chest radiograph performed 07/07/2015 FINDINGS: Mild left basilar opacity likely reflects atelectasis. No pleural effusion or pneumothorax is seen. The cardiomediastinal silhouette is mildly enlarged. The patient is status post median sternotomy. A pacemaker/AICD is noted at the left chest wall, with leads ending at the right atrium and right ventricle. No acute osseous abnormalities are seen. IMPRESSION: Mild left basilar airspace opacity likely reflects atelectasis. Mild cardiomegaly. Electronically Signed   By: Roanna Raider M.D.   On: 11/03/2015 01:06   US Renal  11/03/2015  CLINICAL DATA:  69 year old male with acute renal insufficiency EXAM: RENAL / URINARY TRACT ULTRASOUND COMPLETE COMPARISON:  None. FINDINGS: Evaluation is limited due to patient's body habitus and respiratory motion artifact.  Right Kidney: Length: 10.8 cm. Echogenicity within normal limits. No mass or hydronephrosis visualized. Left Kidney: Length: 10.6 cm. Echogenicity within normal limits. No mass or hydronephrosis visualized. Bladder: Appears normal for degree of bladder distention. IMPRESSION: Unremarkable renal ultrasound. Electronically Signed   By: Elgie Collard M.D.   On: 11/03/2015 04:16    Microbiology: No results found for this or any previous visit (from the past 240 hour(s)).   Labs: Basic Metabolic Panel:  Recent Labs Lab 11/04/15 0455 11/05/15 0421 11/06/15 0452 11/07/15 0443 11/08/15 0450 11/09/15 0505  NA 136 141 135  --  136 135  K 4.8 4.4 4.2  --  4.4 4.5  CL 101 101 94*  --  96* 99*  CO2 28 33* 33*  --  32 29  GLUCOSE 147* 127* 202*  --  121* 246*  BUN 33* 35* 32*  --  33* 32*  CREATININE 1.65* 1.50* 1.55*  --  1.51* 1.39*  CALCIUM 9.2 9.7 9.6  --  9.1 9.2  MG 1.8 2.0 1.8 1.9 1.8 1.8   Liver Function Tests: No results for input(s): AST, ALT, ALKPHOS, BILITOT, PROT, ALBUMIN in the last 168 hours. No results for input(s): LIPASE, AMYLASE in the last 168 hours. No results for input(s): AMMONIA in the last 168 hours. CBC:  Recent Labs Lab 11/03/15 0029 11/08/15 0450  WBC 7.1 6.0  NEUTROABS 4.6  --   HGB 11.3* 14.4  HCT 36.3* 44.7  MCV 103.4* 102.3*  PLT 130* 161   Cardiac Enzymes:  Recent Labs Lab 11/03/15 0348 11/03/15 0932 11/03/15 1554  TROPONINI 0.08* 0.06* 0.06*   BNP: BNP (last 3 results)  Recent Labs  07/07/15 1011 07/08/15 0447 11/03/15 0029  BNP 670.5* 732.5* 697.4*    ProBNP (last 3 results) No results for input(s): PROBNP in the last 8760 hours.  CBG:  Recent Labs Lab 11/07/15 2123 11/08/15 0721 11/08/15 1134 11/08/15 1616 11/08/15 2136  GLUCAP 214* 115* 315* 184* 193*       Signed:  Rhetta Mura  [facility] 11/09/2015, 7:28 AM

## 2015-11-18 ENCOUNTER — Encounter (HOSPITAL_COMMUNITY): Payer: Medicare Other | Admitting: Internal Medicine

## 2016-02-19 ENCOUNTER — Emergency Department (HOSPITAL_COMMUNITY): Payer: Non-veteran care

## 2016-02-19 ENCOUNTER — Encounter (HOSPITAL_COMMUNITY): Payer: Self-pay | Admitting: Emergency Medicine

## 2016-02-19 ENCOUNTER — Emergency Department (HOSPITAL_COMMUNITY)
Admission: EM | Admit: 2016-02-19 | Discharge: 2016-02-20 | Disposition: A | Discharge status: Home / Self Care | Payer: Non-veteran care | Attending: Emergency Medicine | Admitting: Emergency Medicine

## 2016-02-19 DIAGNOSIS — R600 Localized edema: Secondary | ICD-10-CM | POA: Insufficient documentation

## 2016-02-19 DIAGNOSIS — Z79899 Other long term (current) drug therapy: Secondary | ICD-10-CM | POA: Insufficient documentation

## 2016-02-19 DIAGNOSIS — R0602 Shortness of breath: Secondary | ICD-10-CM | POA: Diagnosis present

## 2016-02-19 DIAGNOSIS — Z9861 Coronary angioplasty status: Secondary | ICD-10-CM | POA: Diagnosis not present

## 2016-02-19 DIAGNOSIS — Z95810 Presence of automatic (implantable) cardiac defibrillator: Secondary | ICD-10-CM | POA: Diagnosis not present

## 2016-02-19 DIAGNOSIS — Z7982 Long term (current) use of aspirin: Secondary | ICD-10-CM | POA: Insufficient documentation

## 2016-02-19 DIAGNOSIS — M545 Low back pain, unspecified: Secondary | ICD-10-CM

## 2016-02-19 DIAGNOSIS — Z7902 Long term (current) use of antithrombotics/antiplatelets: Secondary | ICD-10-CM | POA: Diagnosis not present

## 2016-02-19 DIAGNOSIS — I2510 Atherosclerotic heart disease of native coronary artery without angina pectoris: Secondary | ICD-10-CM | POA: Insufficient documentation

## 2016-02-19 DIAGNOSIS — Z87891 Personal history of nicotine dependence: Secondary | ICD-10-CM | POA: Insufficient documentation

## 2016-02-19 DIAGNOSIS — I480 Paroxysmal atrial fibrillation: Secondary | ICD-10-CM | POA: Diagnosis not present

## 2016-02-19 DIAGNOSIS — N182 Chronic kidney disease, stage 2 (mild): Secondary | ICD-10-CM | POA: Insufficient documentation

## 2016-02-19 DIAGNOSIS — Z794 Long term (current) use of insulin: Secondary | ICD-10-CM | POA: Diagnosis not present

## 2016-02-19 DIAGNOSIS — Z8673 Personal history of transient ischemic attack (TIA), and cerebral infarction without residual deficits: Secondary | ICD-10-CM | POA: Insufficient documentation

## 2016-02-19 DIAGNOSIS — K625 Hemorrhage of anus and rectum: Secondary | ICD-10-CM | POA: Insufficient documentation

## 2016-02-19 DIAGNOSIS — E119 Type 2 diabetes mellitus without complications: Secondary | ICD-10-CM | POA: Insufficient documentation

## 2016-02-19 DIAGNOSIS — I129 Hypertensive chronic kidney disease with stage 1 through stage 4 chronic kidney disease, or unspecified chronic kidney disease: Secondary | ICD-10-CM | POA: Diagnosis not present

## 2016-02-19 DIAGNOSIS — J209 Acute bronchitis, unspecified: Secondary | ICD-10-CM | POA: Diagnosis not present

## 2016-02-19 LAB — COMPREHENSIVE METABOLIC PANEL
ALBUMIN: 3.5 g/dL (ref 3.5–5.0)
ALK PHOS: 82 U/L (ref 38–126)
ALT: 24 U/L (ref 17–63)
ANION GAP: 8 (ref 5–15)
AST: 34 U/L (ref 15–41)
BILIRUBIN TOTAL: 0.9 mg/dL (ref 0.3–1.2)
BUN: 42 mg/dL — AB (ref 6–20)
CALCIUM: 8.9 mg/dL (ref 8.9–10.3)
CO2: 24 mmol/L (ref 22–32)
CREATININE: 1.42 mg/dL — AB (ref 0.61–1.24)
Chloride: 105 mmol/L (ref 101–111)
GFR calc Af Amer: 55 mL/min — ABNORMAL LOW (ref >60)
GFR calc non Af Amer: 48 mL/min — ABNORMAL LOW (ref >60)
GLUCOSE: 141 mg/dL — AB (ref 65–99)
Potassium: 4.8 mmol/L (ref 3.5–5.1)
SODIUM: 137 mmol/L (ref 135–145)
TOTAL PROTEIN: 7.6 g/dL (ref 6.5–8.1)

## 2016-02-19 LAB — PROTIME-INR
INR: 1.45 (ref 0.00–1.49)
PROTHROMBIN TIME: 17.2 s — AB (ref 11.6–15.2)

## 2016-02-19 LAB — CBC
HCT: 34.9 % — ABNORMAL LOW (ref 39.0–52.0)
HEMOGLOBIN: 11.5 g/dL — AB (ref 13.0–17.0)
MCH: 33.3 pg (ref 26.0–34.0)
MCHC: 33 g/dL (ref 30.0–36.0)
MCV: 101.2 fL — ABNORMAL HIGH (ref 78.0–100.0)
PLATELETS: 140 10*3/uL — AB (ref 150–400)
RBC: 3.45 MIL/uL — ABNORMAL LOW (ref 4.22–5.81)
RDW: 16.2 % — AB (ref 11.5–15.5)
WBC: 5.5 10*3/uL (ref 4.0–10.5)

## 2016-02-19 LAB — CBG MONITORING, ED: Glucose-Capillary: 135 mg/dL — ABNORMAL HIGH (ref 65–99)

## 2016-02-19 LAB — TYPE AND SCREEN
ABO/RH(D): O POS
ANTIBODY SCREEN: NEGATIVE

## 2016-02-19 LAB — POC OCCULT BLOOD, ED: Fecal Occult Bld: POSITIVE — AB

## 2016-02-19 LAB — ABO/RH: ABO/RH(D): O POS

## 2016-02-19 MED ORDER — ALBUTEROL SULFATE (2.5 MG/3ML) 0.083% IN NEBU
5.0000 mg | INHALATION_SOLUTION | Freq: Once | RESPIRATORY_TRACT | Status: AC
Start: 2016-02-19 — End: 2016-02-19
  Administered 2016-02-19: 5 mg via RESPIRATORY_TRACT
  Filled 2016-02-19: qty 6

## 2016-02-19 NOTE — ED Notes (Signed)
Phyllis/POA [phone]

## 2016-02-19 NOTE — ED Notes (Signed)
Pt demanded food. Stated "he needed to eat/ was feeling his diabetes, had not eaten anything all day. Against my advice was given a Sandwich, cup of water and sherbet.    On assessment pts abdomen is distended.

## 2016-02-19 NOTE — ED Provider Notes (Signed)
CSN: [id number]     Arrival date & time 02/19/16  1532 History   First MD Initiated Contact with Patient 02/19/16 1556     Chief Complaint  Patient presents with  . Shortness of Breath     (Consider location/radiation/quality/duration/timing/severity/associated sxs/prior Treatment) HPI   Eric Mcguire is a 70 y.o. male who is here for evaluation of weakness, shortness of breath, leg edema, and cough productive of yellow sputum. He has been having these symptoms for 2-3 weeks. He is also concerned about having hemorrhoids, and some blood on tissue when wiping after a bowel movement for a couple of days. No other recent illnesses. He's using his usual medications, without relief. There are no other known modifying factors.   Past Medical History  Diagnosis Date  . Stroke [facility](HCC) Dec 2015    [place]  . Hypertension   . Type 2 diabetes mellitus with renal manifestations (HCC)   . Coronary artery disease   . ICD (implantable cardioverter-defibrillator), dual, in situ July 2013    [facility]  [place] [facility] [facility]    [facility] [place]  [place] PAF (paroxysmal atrial fibrillation) (HCC)     not anticoagulated -hx GI bleed  . Ischemic cardiomyopathy Aug 2016    EF 25%   . Aortic stenosis, severe 07/09/2015  . CKD (chronic kidney disease), stage II    Past Surgical History  Procedure Laterality Date  . Cardiac surgery    . Ep implantable device  July 2013    BS duel ICD  . [place]  . Cardiac catheterization N/A 07/10/2015    Procedure: Right/Left Heart Cath and Coronary Angiography;  Surgeon: Corky CraftsJayadeep S Varanasi, MD;  [facility];  Service: Cardiovascular;  Laterality: N/A;  . Peripheral vascular catheterization  07/10/2015    Procedure: Thoracic Aortogram;  Surgeon: Corky CraftsJayadeep S Varanasi, MD;  [facility];  Service: Cardiovascular;;   History reviewed. No pertinent family history. Social History  Substance Use Topics  .  Smoking status: Former Smoker -- 1.00 packs/day    Types: Cigarettes    Quit date: 12/04/1994  . Smokeless tobacco: None  . Alcohol Use: No    Review of Systems  All other systems reviewed and are negative.     Allergies  Prednisone  Home Medications   Prior to Admission medications   Medication Sig Start Date End Date Taking? Authorizing Provider  amiodarone (PACERONE) 200 MG tablet Take 200 mg by mouth daily.   Yes Historical Provider, MD  amLODipine (NORVASC) 5 MG tablet Take 1 tablet (5 mg total) by mouth daily. 11/09/15  Yes Rhetta MuraJai-Gurmukh Samtani, MD  aspirin 81 MG tablet Take 81 mg by mouth daily.   Yes Historical Provider, MD  atorvastatin (LIPITOR) 80 MG tablet Take 40 mg by mouth daily.    Yes Historical Provider, MD  carvedilol (COREG) 3.125 MG tablet Take 3.125 mg by mouth 2 (two) times daily with a meal.   Yes Historical Provider, MD  cholecalciferol (VITAMIN D) 1000 UNITS tablet Take 1,000 Units by mouth daily.   Yes Historical Provider, MD  clopidogrel (PLAVIX) 75 MG tablet Take 75 mg by mouth daily.   Yes Historical Provider, MD  ferrous sulfate 325 (65 FE) MG tablet Take 1 tablet (325 mg total) by mouth 3 (three) times daily with meals. 05/17/15  Yes Garlon HatchetLisa M Sanders, PA-C  furosemide (LASIX) 40 MG tablet Take 1  tablet (40 mg total) by mouth 2 (two) times daily. 11/09/15  Yes Rhetta Mura, MD  insulin NPH-regular Human (NOVOLIN 70/30) (70-30) 100 UNIT/ML injection Inject 50-55 Units into the skin 2 (two) times daily. 45 units in the am and 40 units at night. Patient taking differently: Inject 50-55 Units into the skin 2 (two) times daily. 55 units in the am and 50 units at night. 07/13/15  Yes Zannie Cove, MD  magnesium oxide (MAG-OX) 400 MG tablet Take 400 mg by mouth daily.   Yes Historical Provider, MD  pantoprazole (PROTONIX) 40 MG tablet Take 40 mg by mouth daily.   Yes Historical Provider, MD  pregabalin (LYRICA) 75 MG capsule Take 75 mg by mouth daily.    Yes Historical Provider, MD  acetaminophen (TYLENOL 8 HOUR) 650 MG CR tablet Take 1 tablet (650 mg total) by mouth every 8 (eight) hours as needed for pain. 01/28/15   Emilia Beck, PA-C  doxycycline (VIBRAMYCIN) 100 MG capsule Take 1 capsule (100 mg total) by mouth 2 (two) times daily. 02/20/16   Mancel Bale, MD  HYDROcodone-acetaminophen (NORCO) 5-325 MG tablet Take 1 tablet by mouth every 4 (four) hours as needed. 02/20/16   Mancel Bale, MD  lisinopril (PRINIVIL,ZESTRIL) 5 MG tablet Take 1 tablet (5 mg total) by mouth daily. Patient not taking: Reported on 02/19/2016 07/13/15   Zannie Cove, MD  methimazole (TAPAZOLE) 5 MG tablet Take 1 tablet (5 mg total) by mouth 2 (two) times daily. Patient not taking: Reported on 02/19/2016 07/13/15   Zannie Cove, MD  OVER THE COUNTER MEDICATION Take 10 mg by mouth daily as needed (allergies).    Historical Provider, MD   BP 156/74 mmHg  Pulse 66  Temp(Src) 98.4 F (36.9 C) (Oral)  Resp 20  SpO2 92% Physical Exam  Constitutional: He is oriented to person, place, and time. He appears well-developed and well-nourished. No distress.  HENT:  Head: Normocephalic and atraumatic.  Right Ear: External ear normal.  Left Ear: External ear normal.  Eyes: Conjunctivae and EOM are normal. Pupils are equal, round, and reactive to light.  Neck: Normal range of motion and phonation normal. Neck supple.  Cardiovascular: Normal rate, regular rhythm and normal heart sounds.   Pulmonary/Chest: Effort normal. No respiratory distress. He exhibits no bony tenderness.  Somewhat decreased air movement bilaterally with scattered rhonchi and wheezes.  Abdominal: Soft. He exhibits no distension. There is no tenderness.  Genitourinary:  Normal anus. No evident anal fissure. Small amount of brown stool in the rectal vault. No distinct palpable internal hemorrhoids, and no visualized external hemorrhoids.  Musculoskeletal: Normal range of motion. He exhibits edema (1-2+  chronic appearing lower leg edema.). He exhibits no tenderness.  Neurological: He is alert and oriented to person, place, and time. No cranial nerve deficit or sensory deficit. He exhibits normal muscle tone. Coordination normal.  Skin: Skin is warm, dry and intact.  Psychiatric: He has a normal mood and affect. His behavior is normal. Judgment and thought content normal.  Nursing note and vitals reviewed.   ED Course  Procedures (including critical care time)  Medications  albuterol (PROVENTIL HFA;VENTOLIN HFA) 108 (90 Base) MCG/ACT inhaler 2 puff (not administered)  aerochamber Z-Stat Plus/medium 1 each (not administered)  HYDROcodone-acetaminophen (NORCO/VICODIN) 5-325 MG per tablet 2 tablet (not administered)  albuterol (PROVENTIL) (2.5 MG/3ML) 0.083% nebulizer solution 5 mg (5 mg Nebulization Given 02/19/16 1616)    Patient Vitals for the past 24 hrs:  BP Temp Temp src Pulse Resp  SpO2  02/20/16 0006 156/74 mmHg - - 66 20 92 %  02/19/16 2213 191/88 mmHg 98.4 F (36.9 C) Oral 69 24 96 %  02/19/16 2110 182/91 mmHg 97.4 F (36.3 C) Oral 67 26 96 %  02/19/16 1900 174/84 mmHg - - 62 22 93 %  02/19/16 1800 165/77 mmHg - - 60 18 90 %  02/19/16 1540 - 97.4 F (36.3 C) Oral 61 18 92 %    9:37 PM Reevaluation with update and discussion. After initial assessment and treatment, an updated evaluation reveals Patient is resting comfortably. Oxygen saturation 94%. He states that he feels somewhat better at this time. He's been able to eat. Bilbo Carcamo L   00:40- patient has now been ambulated, on room air, oxygen saturation 98%. No sensation of shortness of breath, but he did complain of low back pain. He states he needs something for his back pain. 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 He gets care for that. At the [facility] hospital.  Labs Review Labs Reviewed  COMPREHENSIVE METABOLIC PANEL - Abnormal; Notable for the following:    Glucose, Bld 141 (*)    BUN 42 (*)     Creatinine, Ser 1.42 (*)    GFR calc non Af Amer 48 (*)    GFR calc Af Amer 55 (*)    All other components within normal limits  CBC - Abnormal; Notable for the following:    RBC 3.45 (*)    Hemoglobin 11.5 (*)    HCT 34.9 (*)    MCV 101.2 (*)    RDW 16.2 (*)    Platelets 140 (*)    All other components within normal limits  PROTIME-INR - Abnormal; Notable for the following:    Prothrombin Time 17.2 (*)    All other components within normal limits  CBG MONITORING, ED - Abnormal; Notable for the following:    Glucose-Capillary 135 (*)    All other components within normal limits  POC OCCULT BLOOD, ED - Abnormal; Notable for the following:    Fecal Occult Bld POSITIVE (*)    All other components within normal limits  TYPE AND SCREEN  ABO/RH    Imaging Review Dg Chest 2 View  02/19/2016  CLINICAL DATA:  Initial encounter. Initial encounter. 70 y/o male with c/o SOB x2 weeks, had cough as well but has cleared up. Hx COPD, CABG, pacemaker, HTN, DM, CAD EXAM: CHEST  2 VIEW COMPARISON:  None. FINDINGS: LEFT-sided pacemaker overlies normal cardiac silhouette. Normal pulmonary vasculature No effusion, infiltrate or pneumothorax. No acute osseous abnormality. IMPRESSION: No acute cardiopulmonary process. Electronically Signed   By: Genevive Bi M.D.   On: 02/19/2016 16:16   I have personally reviewed and evaluated these images and lab results as part of my medical decision-making.   EKG Interpretation   Date/Time:  Friday February 19 2016 15:49:34 EDT Ventricular Rate:  66 PR Interval:  179 QRS Duration: 144 QT Interval:  464 QTC Calculation: 486 R Axis:   77 Text Interpretation:  Sinus rhythm Left atrial enlargement Left  ventricular hypertrophy Borderline T abnormalities, lateral leads  Borderline prolonged QT interval since last tracing no significant change  Confirmed by Effie Shy  MD, Mechele Collin ([id number]) on 02/19/2016 3:55:39 PM      MDM   Final diagnoses:  Acute bronchitis,  unspecified organism  Rectal bleeding  Left-sided low back pain without sciatica    Nonspecific shortness of breath, weakness and leg edema. He is hemodynamically stable.  Rectal bleeding. His last sedimentation rate maybe related to occult anal fissure, versus hemorrhoid, and this can be followed up as an outpatient. No indication for further treatment or hospitalization at this time. Doubt pneumonia, metabolic instability or impending vascular collapse.  Nursing Notes Reviewed/ Care Coordinated Applicable Imaging Reviewed Interpretation of Laboratory Data incorporated into ED treatment  The patient appears reasonably screened and/or stabilized for discharge and I doubt any other medical condition or other [facility] requiring further screening, evaluation, or treatment in the ED at this time prior to discharge.  Plan: Home Medications- albuterol inhaler ,Norco; Home Treatments- rest, fluids; return here if the recommended treatment, does not improve the symptoms; Recommended follow up- PCP follow-up in one week for checkup, further assessment and evaluation of rectal bleeding   Mancel Bale, MD 02/20/16 [phone]

## 2016-02-19 NOTE — ED Notes (Signed)
Patient placed on O2 3L/min via [place]. Sats 88-90% while sleeping. Patient has sleep apnea.

## 2016-02-19 NOTE — ED Notes (Signed)
Pt c/o weakness, SOB, bilateral pitting edema in ankles, copious amounts of bright red blood in stool. Lung sounds significantly diminished, wheezing.

## 2016-02-20 MED ORDER — HYDROCODONE-ACETAMINOPHEN 5-325 MG PO TABS: 1.0000 | ORAL_TABLET | ORAL | Status: DC | PRN

## 2016-02-20 MED ORDER — AEROCHAMBER Z-STAT PLUS/MEDIUM MISC
1.0000 | Freq: Once | Status: AC
  Administered 2016-02-20: 1
  Filled 2016-02-20: qty 1

## 2016-02-20 MED ORDER — DOXYCYCLINE HYCLATE 100 MG PO CAPS: 100.0000 mg | ORAL_CAPSULE | Freq: Two times a day (BID) | ORAL | Status: DC

## 2016-02-20 MED ORDER — ALBUTEROL SULFATE HFA 108 (90 BASE) MCG/ACT IN AERS
2.0000 | INHALATION_SPRAY | RESPIRATORY_TRACT | Status: DC | PRN
Start: 2016-02-20 — End: 2016-02-20
  Administered 2016-02-20: 2 via RESPIRATORY_TRACT
  Filled 2016-02-20: qty 6.7

## 2016-02-20 MED ORDER — HYDROCODONE-ACETAMINOPHEN 5-325 MG PO TABS
2.0000 | ORAL_TABLET | Freq: Once | ORAL | Status: AC
  Administered 2016-02-20: 2 via ORAL
  Filled 2016-02-20: qty 2

## 2016-02-20 NOTE — Discharge Instructions (Signed)
Use inhaler 2 puffs every 3-4 hours as needed for cough or trouble breathing. Complaining of rest and drink a lot of fluids. Follow-up with your doctor for checkup and repeat evaluation of rectal bleeding in 1 week.   Acute Bronchitis Bronchitis is inflammation of the airways that extend from the windpipe into the lungs (bronchi). The inflammation often causes mucus to develop. This leads to a cough, which is the most common symptom of bronchitis.  In acute bronchitis, the condition usually develops suddenly and goes away over time, usually in a couple weeks. Smoking, allergies, and asthma can make bronchitis worse. Repeated episodes of bronchitis may cause further lung problems.  CAUSES Acute bronchitis is most often caused by the same virus that causes a cold. The virus can spread from person to person (contagious) through coughing, sneezing, and touching contaminated objects. SIGNS AND SYMPTOMS   Cough.   Fever.   Coughing up mucus.   Body aches.   Chest congestion.   Chills.   Shortness of breath.   Sore throat.  DIAGNOSIS  Acute bronchitis is usually diagnosed through a physical exam. Your health care provider will also ask you questions about your medical history. Tests, such as chest X-rays, are sometimes done to rule out other conditions.  TREATMENT  Acute bronchitis usually goes away in a couple weeks. Oftentimes, no medical treatment is necessary. Medicines are sometimes given for relief of fever or cough. Antibiotic medicines are usually not needed but may be prescribed in certain situations. In some cases, an inhaler may be recommended to help reduce shortness of breath and control the cough. A cool mist vaporizer may also be used to help thin bronchial secretions and make it easier to clear the chest.  HOME CARE INSTRUCTIONS  Get plenty of rest.   Drink enough fluids to keep your urine clear or pale yellow (unless you have a medical condition that requires  fluid restriction). Increasing fluids may help thin your respiratory secretions (sputum) and reduce chest congestion, and it will prevent dehydration.   Take medicines only as directed by your health care provider.  If you were prescribed an antibiotic medicine, finish it all even if you start to feel better.  Avoid smoking and secondhand smoke. Exposure to cigarette smoke or irritating chemicals will make bronchitis worse. If you are a smoker, consider using nicotine gum or skin patches to help control withdrawal symptoms. Quitting smoking will help your lungs heal faster.   Reduce the chances of another bout of acute bronchitis by washing your hands frequently, avoiding people with cold symptoms, and trying not to touch your hands to your mouth, nose, or eyes.   Keep all follow-up visits as directed by your health care provider.  SEEK MEDICAL CARE IF: Your symptoms do not improve after 1 week of treatment.  SEEK IMMEDIATE MEDICAL CARE IF:  You develop an increased fever or chills.   You have chest pain.   You have severe shortness of breath.  You have bloody sputum.   You develop dehydration.  You faint or repeatedly feel like you are going to pass out.  You develop repeated vomiting.  You develop a severe headache. MAKE SURE YOU:   Understand these instructions.  Will watch your condition.  Will get help right away if you are not doing well or get worse.   This information is not intended to replace advice given to you by your health care provider. Make sure you discuss any questions you have with your  health care provider.   Document Released: 12/22/2004 Document Revised: 12/05/2014 Document Reviewed: 05/07/2013 Elsevier Interactive Patient Education 2016 Elsevier Inc.  Back Pain, Adult Back pain is very common in adults.The cause of back pain is rarely dangerous and the pain often gets better over time.The cause of your back pain may not be known. Some  common causes of back pain include:  Strain of the muscles or ligaments supporting the spine.  Wear and tear (degeneration) of the spinal disks.  Arthritis.  Direct injury to the back. For many people, back pain may return. Since back pain is rarely dangerous, most people can learn to manage this condition on their own. HOME CARE INSTRUCTIONS Watch your back pain for any changes. The following actions may help to lessen any discomfort you are feeling:  Remain active. It is stressful on your back to sit or stand in one place for long periods of time. Do not sit, drive, or stand in one place for more than 30 minutes at a time. Take short walks on even surfaces as soon as you are able.Try to increase the length of time you walk each day.  Exercise regularly as directed by your health care provider. Exercise helps your back heal faster. It also helps avoid future injury by keeping your muscles strong and flexible.  Do not stay in bed.Resting more than 1-2 days can delay your recovery.  Pay attention to your body when you bend and lift. The most comfortable positions are those that put less stress on your recovering back. Always use proper lifting techniques, including:  Bending your knees.  Keeping the load close to your body.  Avoiding twisting.  Find a comfortable position to sleep. Use a firm mattress and lie on your side with your knees slightly bent. If you lie on your back, put a pillow under your knees.  Avoid feeling anxious or stressed.Stress increases muscle tension and can worsen back pain.It is important to recognize when you are anxious or stressed and learn ways to manage it, such as with exercise.  Take medicines only as directed by your health care provider. Over-the-counter medicines to reduce pain and inflammation are often the most helpful.Your health care provider may prescribe muscle relaxant drugs.These medicines help dull your pain so you can more quickly  return to your normal activities and healthy exercise.  Apply ice to the injured area:  Put ice in a plastic bag.  Place a towel between your skin and the bag.  Leave the ice on for 20 minutes, 2-3 times a day for the first 2-3 days. After that, ice and heat may be alternated to reduce pain and spasms.  Maintain a healthy weight. Excess weight puts extra stress on your back and makes it difficult to maintain good posture. SEEK MEDICAL CARE IF:  You have pain that is not relieved with rest or medicine.  You have increasing pain going down into the legs or buttocks.  You have pain that does not improve in one week.  You have night pain.  You lose weight.  You have a fever or chills. SEEK IMMEDIATE MEDICAL CARE IF:   You develop new bowel or bladder control problems.  You have unusual weakness or numbness in your arms or legs.  You develop nausea or vomiting.  You develop abdominal pain.  You feel faint.   This information is not intended to replace advice given to you by your health care provider. Make sure you discuss any questions you  have with your health care provider.   Document Released: 11/14/2005 Document Revised: 12/05/2014 Document Reviewed: 03/18/2014 Elsevier Interactive Patient Education 2016 Elsevier Inc.  Gastrointestinal Bleeding Gastrointestinal (GI) bleeding means there is bleeding somewhere along the digestive tract, between the mouth and anus. CAUSES  There are many different problems that can cause GI bleeding. Possible causes include:  Esophagitis. This is inflammation, irritation, or swelling of the esophagus.  Hemorrhoids.These are veins that are full of blood (engorged) in the rectum. They cause pain, inflammation, and may bleed.  Anal fissures.These are areas of painful tearing which may bleed. They are often caused by passing hard stool.  Diverticulosis.These are pouches that form on the colon over time, with age, and may bleed  significantly.  Diverticulitis.This is inflammation in areas with diverticulosis. It can cause pain, fever, and bloody stools, although bleeding is rare.  Polyps and cancer. Colon cancer often starts out as precancerous polyps.  Gastritis and ulcers.Bleeding from the upper gastrointestinal tract (near the stomach) may travel through the intestines and produce black, sometimes tarry, often bad smelling stools. In certain cases, if the bleeding is fast enough, the stools may not be black, but red. This condition may be life-threatening. SYMPTOMS   Vomiting bright red blood or material that looks like coffee grounds.  Bloody, black, or tarry stools. DIAGNOSIS  Your caregiver may diagnose your condition by taking your history and performing a physical exam. More tests may be needed, including:  X-rays and other imaging tests.  Esophagogastroduodenoscopy (EGD). This test uses a flexible, lighted tube to look at your esophagus, stomach, and small intestine.  Colonoscopy. This test uses a flexible, lighted tube to look at your colon. TREATMENT  Treatment depends on the cause of your bleeding.   For bleeding from the esophagus, stomach, small intestine, or colon, the caregiver doing your EGD or colonoscopy may be able to stop the bleeding as part of the procedure.  Inflammation or infection of the colon can be treated with medicines.  Many rectal problems can be treated with creams, suppositories, or warm baths.  Surgery is sometimes needed.  Blood transfusions are sometimes needed if you have lost a lot of blood. If bleeding is slow, you may be allowed to go home. If there is a lot of bleeding, you will need to stay in the hospital for observation. HOME CARE INSTRUCTIONS   Take any medicines exactly as prescribed.  Keep your stools soft by eating foods that are high in fiber. These foods include whole grains, legumes, fruits, and vegetables. Prunes (1 to 3 a day) work well for many  people.  Drink enough fluids to keep your urine clear or pale yellow. SEEK IMMEDIATE MEDICAL CARE IF:   Your bleeding increases.  You feel lightheaded, weak, or you faint.  You have severe cramps in your back or abdomen.  You pass large blood clots in your stool.  Your problems are getting worse. MAKE SURE YOU:   Understand these instructions.  Will watch your condition.  Will get help right away if you are not doing well or get worse.   This information is not intended to replace advice given to you by your health care provider. Make sure you discuss any questions you have with your health care provider.   Document Released: 11/11/2000 Document Revised: 108-26-2013 Document Reviewed: 05/04/2015 Elsevier Interactive Patient Education [employer]

## 2016-06-24 ENCOUNTER — Encounter: Payer: Self-pay | Admitting: Vascular Surgery

## 2016-06-30 ENCOUNTER — Other Ambulatory Visit: Payer: Self-pay

## 2016-06-30 ENCOUNTER — Ambulatory Visit (HOSPITAL_COMMUNITY): Payer: Non-veteran care | Attending: Vascular Surgery

## 2016-06-30 ENCOUNTER — Encounter: Payer: Non-veteran care | Admitting: Vascular Surgery

## 2016-06-30 DIAGNOSIS — I6521 Occlusion and stenosis of right carotid artery: Secondary | ICD-10-CM

## 2016-07-26 ENCOUNTER — Encounter: Payer: Self-pay | Admitting: Vascular Surgery

## 2016-08-19 ENCOUNTER — Encounter: Payer: Self-pay | Admitting: Vascular Surgery

## 2016-08-26 ENCOUNTER — Telehealth: Payer: Self-pay | Admitting: Vascular Surgery

## 2016-08-26 ENCOUNTER — Encounter: Payer: Self-pay | Admitting: Vascular Surgery

## 2016-08-26 ENCOUNTER — Ambulatory Visit (HOSPITAL_COMMUNITY)
Admission: RE | Admit: 2016-08-26 | Discharge: 2016-08-26 | Disposition: A | Discharge status: Home / Self Care | Payer: Non-veteran care | Source: Ambulatory Visit | Attending: Vascular Surgery | Admitting: Vascular Surgery

## 2016-08-26 ENCOUNTER — Ambulatory Visit (INDEPENDENT_AMBULATORY_CARE_PROVIDER_SITE_OTHER): Payer: Non-veteran care | Admitting: Vascular Surgery

## 2016-08-26 DIAGNOSIS — I6521 Occlusion and stenosis of right carotid artery: Secondary | ICD-10-CM

## 2016-08-26 DIAGNOSIS — I739 Peripheral vascular disease, unspecified: Principal | ICD-10-CM

## 2016-08-26 DIAGNOSIS — I779 Disorder of arteries and arterioles, unspecified: Secondary | ICD-10-CM | POA: Diagnosis not present

## 2016-08-26 LAB — VAS US CAROTID
RIGHT CCA MID DIAS: -6 cm/s
RIGHT ECA DIAS: -6 cm/s
Right CCA prox sys: 61 cm/s
Right cca dist sys: -59 cm/s

## 2016-08-26 NOTE — Progress Notes (Signed)
New Carotid Patient  Referred by:  Dr. Ivory Broad  Reason for referral: R carotid stenosis  History of Present Illness  Eric Mcguire is a 70 y.o. (04-17-1946) male who presents with chief complaint: "bad neck artery".  Pt has previous had a L CEA for a CVA.  He has been undergoing surveillance studies and recently his carotid studies demonstrated: RICA >80% stenosis, LICA patent CEA.  The patient has never had amaurosis fugax or monocular blindness.  The patient has never had facial drooping or hemiplegia.  He has had residual R hand weakness and deficits however.  The patient has never had receptive or expressive aphasia.     The patient's risks factors for carotid disease include: HTN, DM, smoking.  Past Medical History:  Diagnosis Date  . Aortic stenosis, severe 07/09/2015  . Carotid artery occlusion   . CKD (chronic kidney disease), stage II   . Coronary artery disease   . Hypertension   . 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 Ischemic cardiomyopathy Aug 2016   EF 25%   . PAF (paroxysmal atrial fibrillation) (HCC)    not anticoagulated -hx GI bleed  . S/P [facility] x 3 [facility] [place]  . Stroke [facility]) Dec 2015   Lt brain, [place]  . Type 2 diabetes mellitus with renal manifestations [facility])     Past Surgical History:  Procedure Laterality Date  . CARDIAC CATHETERIZATION N/A 07/10/2015   Procedure: Right/Left Heart Cath and Coronary Angiography;  Surgeon: Corky Crafts, MD;  Location: [facility] INVASIVE CV LAB;  Service: Cardiovascular;  Laterality: N/A;  . CARDIAC SURGERY    . [place]  Dec 2015   [place]  . EP IMPLANTABLE DEVICE  July 2013   BS duel ICD  . PERIPHERAL VASCULAR CATHETERIZATION  07/10/2015   Procedure: Thoracic Aortogram;  Surgeon: Corky Crafts, MD;  Location: [facility] INVASIVE CV LAB;  Service: Cardiovascular;;    Social History   Social History  . Marital status: Divorced    Spouse  name: N/A  . Number of children: N/A  . Years of education: N/A   Occupational History  . Not on file.   Social History Main Topics  . Smoking status: Former Smoker    Packs/day: 1.00    Types: Cigarettes    Quit date: 12/04/1994  . Smokeless tobacco: Not on file  . Alcohol use No  . Drug use: Unknown  . Sexual activity: Not on file   Other Topics Concern  . Not on file   Social History Narrative  . No narrative on file    Family History: patient is unable to detail the medical history of his parents   Current Outpatient Prescriptions  Medication Sig Dispense Refill  . acetaminophen (TYLENOL 8 HOUR) 650 MG CR tablet Take 1 tablet (650 mg total) by mouth every 8 (eight) hours as needed for pain. 30 tablet 0  . amiodarone (PACERONE) 200 MG tablet Take 200 mg by mouth daily.    Marland Kitchen amLODipine (NORVASC) 5 MG tablet Take 1 tablet (5 mg total) by mouth daily. 30 tablet 0  . aspirin 81 MG tablet Take 81 mg by mouth daily.    Marland Kitchen atorvastatin (LIPITOR) 80 MG tablet Take 40 mg by mouth daily.     . carvedilol (COREG) 3.125 MG tablet Take 3.125 mg by mouth 2 (two) times daily with a meal.    . cholecalciferol (VITAMIN  D) 1000 UNITS tablet Take 1,000 Units by mouth daily.    . clopidogrel (PLAVIX) 75 MG tablet Take 75 mg by mouth daily.    Marland Kitchen. doxycycline (VIBRAMYCIN) 100 MG capsule Take 1 capsule (100 mg total) by mouth 2 (two) times daily. 20 capsule 0  . ferrous sulfate 325 (65 FE) MG tablet Take 1 tablet (325 mg total) by mouth 3 (three) times daily with meals. 90 tablet 0  . furosemide (LASIX) 40 MG tablet Take 1 tablet (40 mg total) by mouth 2 (two) times daily. 30 tablet 0  . HYDROcodone-acetaminophen (NORCO) 5-325 MG tablet Take 1 tablet by mouth every 4 (four) hours as needed. 12 tablet 0  . insulin NPH-regular Human (NOVOLIN 70/30) (70-30) 100 UNIT/ML injection Inject 50-55 Units into the skin 2 (two) times daily. 45 units in the am and 40 units at night. (Patient taking differently:  Inject 50-55 Units into the skin 2 (two) times daily. 55 units in the am and 50 units at night.)    . lisinopril (PRINIVIL,ZESTRIL) 5 MG tablet Take 1 tablet (5 mg total) by mouth daily. (Patient not taking: Reported on 02/19/2016) 30 tablet 0  . magnesium oxide (MAG-OX) 400 MG tablet Take 400 mg by mouth daily.    . methimazole (TAPAZOLE) 5 MG tablet Take 1 tablet (5 mg total) by mouth 2 (two) times daily. (Patient not taking: Reported on 02/19/2016) 30 tablet 0  . OVER THE COUNTER MEDICATION Take 10 mg by mouth daily as needed (allergies).    . pantoprazole (PROTONIX) 40 MG tablet Take 40 mg by mouth daily.    . pregabalin (LYRICA) 75 MG capsule Take 75 mg by mouth daily.     No current facility-administered medications for this visit.     Allergies  Allergen Reactions  . Prednisone Other (See Comments)    Stroke      REVIEW OF SYSTEMS:   Cardiac:  positive for: no symptoms, negative for: Chest pain or chest pressure, Shortness of breath upon exertion and Shortness of breath when lying flat,   Vascular:  positive for: no symptoms,  negative for: Pain in calf, thigh, or hip brought on by ambulation, Pain in feet at night that wakes you up from your sleep, Blood clot in your veins and Leg swelling  Pulmonary:  positive for: no symptoms,  negative for: Oxygen at home, Productive cough and Wheezing  Neurologic:  positive for: Sudden weakness in arms or legs, negative for: Sudden numbness in arms or legs, Sudden onset of difficulty speaking or slurred speech, Temporary loss of vision in one eye and Problems with dizziness  Gastrointestinal:  positive for: no symptoms, negative for: Blood in stool and Vomited blood  Genitourinary:  positive for: no symptoms, negative for: Burning when urinating and Blood in urine  Psychiatric:  positive for: no symptoms,  negative for: Major depression  Hematologic:  positive for: no symptoms,  negative for: negative for: Bleeding problems  and Problems with blood clotting too easily  Dermatologic:  positive for: no symptoms, negative for: Rashes or ulcers  Constitutional:  positive for: no symptoms, negative for: Fever or chills   For VQI Use Only  PRE-ADM LIVING: Home  AMB STATUS: Ambulatory  CAD Sx: History of MI, but no symptoms No MI within 6 months  PRIOR CHF: None  STRESS TEST: No   Physical Examination  Vitals:   08/26/16 1156 08/26/16 1157  BP: (!) 156/71 (!) 154/77  Pulse: 63   Resp: 18  Temp: 98.2 F (36.8 C)   TempSrc: Oral   SpO2: 94%   Weight: 244 lb 3.2 oz (110.8 kg)   Height: 6\' 1"  (1.854 m)     Body mass index is 32.22 kg/m.  General: Alert, O x 3, WD,NAD  Head: [place]/AT,   Ear/Nose/Throat: Hearing grossly intact, nares without erythema or drainage, oropharynx without Erythema or Exudate , Mallampati score: 3, Dentition intact  Eyes: PERRLA, EOMI,   Neck: Supple, mid-line trachea,    Pulmonary: Sym exp, good B air movt,CTA B  Cardiac: RRR, Nl S1, S2, no Murmurs, No rubs, No S3,S4  Vascular: Vessel Right Left  Radial Palpable Palpable  Brachial Palpable Palpable  Carotid Palpable, No Bruit Palpable, No Bruit  Aorta Not palpable N/A  Femoral Palpable Palpable  Popliteal Not palpable Not palpable  PT Not palpable Not palpable  DP Not palpable Not palpable   Gastrointestinal: soft, non-distended, non-tender to palpation, No guarding or rebound, no HSM, no masses, no CVAT B, No palpable prominent aortic pulse,    Musculoskeletal: M/S 5/5 throughout except R hand deformity due to residual motor deficits, Extremities without ischemic changes  , No edema present  , No LDS present  Neurologic: CN 2-12 intact , Pain and light touch intact in extremities , Motor exam as listed above  Psychiatric: Judgement intact, Mood & affect appropriate for pt's clinical situation  Dermatologic: See M/S exam for extremity exam, No rashes otherwise noted  Lymph : Palpable lymph nodes:  None   Non-Invasive Vascular Imaging  R CAROTID DUPLEX (Date: 08/26/2016):   R ICA stenosis: 80-99% (445/127 c/s)  R VA: patent and antegrade   Outside Studies/Documentation 15 pages of outside documents were reviewed including: [facility].   Medical Decision Making  Kaylub Detienne is a 70 y.o. male who presents with: asx R ICA stenosis >90%, CAD s/p CABG, known cardiomyopathy, mild AS   Based on the patient's vascular studies and examination, I have offered the patient: Cardiology preop evaluation to facilitate Anesthesia evaluation prior R CEA.  If his preop risk stratification is high, will proceed with B carotid angio to determine if candidate for R CAS.  I discussed in depth with the patient the nature of atherosclerosis, and emphasized the importance of maximal medical management including strict control of blood pressure, blood glucose, and lipid levels, obtaining regular exercise, antiplatelet agents, and cessation of smoking.   The patient is currently on a statin:  Lipitor.. The patient is currently on an anti-platelet: ASA.  The patient is aware that without maximal medical management the underlying atherosclerotic disease process will progress, limiting the benefit of any interventions.  Pt will follow up after Cardiology evaluate for preop counseling.  Thank you for allowing [place] to participate in this patient's care.   Leonides Sake, MD, FACS [facility] Office: [phone] Pager: [phone]  08/26/2016, 12:16 PM

## 2016-08-26 NOTE — Addendum Note (Signed)
Addended by: Yolonda KidaEVANS, ASHLEIGH N on: 08/26/2016 01:04 PM   Modules accepted: Orders

## 2016-08-26 NOTE — Telephone Encounter (Signed)
Per Dr.Chen's instructions I scheduled an appt for this patient to see Sherre PootLuke Kilroy,PA for Dr.Berry on 09/06/16 at 1:30pm. Pt should arrive at 1:15pm [address]. This is for pre-op clearance. He is also scheduled to see [facility] on 10/19.17 at 1:15pm. I left a voicemail for the patient on his cell ph#. I was unable to leave a message on the home phone #. And I mailed a letter with both appt dates/times. awt

## 2016-09-06 ENCOUNTER — Encounter: Payer: Self-pay | Admitting: Cardiology

## 2016-09-06 ENCOUNTER — Ambulatory Visit (INDEPENDENT_AMBULATORY_CARE_PROVIDER_SITE_OTHER): Payer: Medicare Other | Admitting: Cardiology

## 2016-09-06 DIAGNOSIS — Z951 Presence of aortocoronary bypass graft: Secondary | ICD-10-CM

## 2016-09-06 DIAGNOSIS — I255 Ischemic cardiomyopathy: Secondary | ICD-10-CM

## 2016-09-06 DIAGNOSIS — I350 Nonrheumatic aortic (valve) stenosis: Secondary | ICD-10-CM

## 2016-09-06 DIAGNOSIS — Z8673 Personal history of transient ischemic attack (TIA), and cerebral infarction without residual deficits: Secondary | ICD-10-CM

## 2016-09-06 DIAGNOSIS — I480 Paroxysmal atrial fibrillation: Secondary | ICD-10-CM

## 2016-09-06 DIAGNOSIS — I2589 Other forms of chronic ischemic heart disease: Secondary | ICD-10-CM

## 2016-09-06 DIAGNOSIS — G4733 Obstructive sleep apnea (adult) (pediatric): Secondary | ICD-10-CM

## 2016-09-06 DIAGNOSIS — Z01810 Encounter for preprocedural cardiovascular examination: Secondary | ICD-10-CM | POA: Insufficient documentation

## 2016-09-06 DIAGNOSIS — I739 Peripheral vascular disease, unspecified: Secondary | ICD-10-CM

## 2016-09-06 DIAGNOSIS — Z95810 Presence of automatic (implantable) cardiac defibrillator: Secondary | ICD-10-CM

## 2016-09-06 NOTE — Assessment & Plan Note (Signed)
Compliant with C-pap 

## 2016-09-06 NOTE — Assessment & Plan Note (Signed)
Moderate to severe by echo, mild at cath Aug 2016

## 2016-09-06 NOTE — Assessment & Plan Note (Signed)
[facility] 

## 2016-09-06 NOTE — Assessment & Plan Note (Signed)
Admitted in Dec 2016 for CHF (we did not see then) but no problems since.

## 2016-09-06 NOTE — Assessment & Plan Note (Signed)
Now with progression in RICA

## 2016-09-06 NOTE — Assessment & Plan Note (Signed)
Seen today for pre op clearance for proposed RCE

## 2016-09-06 NOTE — Progress Notes (Signed)
09/06/2016 Eric Mcguire   04/22/1946  [id number]  Primary Physician Dorene GrebeSEKHON, MANHARPRETT, MD [facility] ( Dr Allyson SabalBerry when admitted here)  HPI:  70 y/o obese AA male [facility] [place] with a history of CAD-s/p CABG and PCI, PVD, ICM, s/p ICD, and OSA on C-pap. [place] CAB[place] x 3 in [place] in 2003. He subsequently had a PCI (? Details). In 2013 he had a BS ICD placed in [place]. In Dec 2015 he had a CVA and LCE in [place]. We last saw the pt in Aug 2016 when he was admitted with CHF. There was concern the pt had severe AS and it was decided to proceed with Rt and Lt heart cath. This revealed patent L-LAD and S-OM. The S-RCA was occluded with patent stents in the native RCA. The AS turned out to be not be significant. [facility] [place]. He was admitted in Dec 2016 for CHF but we did not see him during that admission. Echo Dec 2016 showed his EF to be 35%- (again with moderate to severe AS, mild at cath 3 months earlier).   Dr Imogene Burnhen has followed the pt for carotid disease. He has noted progression in his RICA to > 80% and the pt is here today for pre op cardiovascular clearance. The pt denies any chest pain or NTG use. He says he can walk up a flight of stairs "no problem". He has not been hospitalized for CHF since Dec 2016 and has had no unusual dyspnea.    Current Outpatient Prescriptions  Medication Sig Dispense Refill  . acetaminophen (TYLENOL 8 HOUR) 650 MG CR tablet Take 1 tablet (650 mg total) by mouth every 8 (eight) hours as needed for pain. 30 tablet 0  . amiodarone (PACERONE) 200 MG tablet Take 200 mg by mouth daily.    Marland Kitchen. amLODipine (NORVASC) 5 MG tablet Take 1 tablet (5 mg total) by mouth daily. 30 tablet 0  . aspirin 81 MG tablet Take 81 mg by mouth daily.    Marland Kitchen. atorvastatin (LIPITOR) 80 MG tablet Take 80 mg by mouth daily.     . carvedilol (COREG) 3.125 MG tablet Take 3.125 mg by mouth 2 (two) times daily with a meal.      . cholecalciferol (VITAMIN D) 1000 UNITS tablet Take 1,000 Units by mouth daily.    . clopidogrel (PLAVIX) 75 MG tablet Take 75 mg by mouth daily.     . ferrous sulfate 325 (65 FE) MG tablet Take 1 tablet (325 mg total) by mouth 3 (three) times daily with meals. 90 tablet 0  . furosemide (LASIX) 40 MG tablet Take 40 mg by mouth 3 (three) times daily.    . insulin NPH-regular Human (NOVOLIN 70/30) (70-30) 100 UNIT/ML injection Inject 50-55 Units into the skin 2 (two) times daily. 45 units in the am and 40 units at night. (Patient taking differently: Inject 50-55 Units into the skin 2 (two) times daily. 55 units in the am and 50 units at night.)    . lisinopril (PRINIVIL,ZESTRIL) 5 MG tablet Take 1 tablet (5 mg total) by mouth daily. 30 tablet 0  . magnesium oxide (MAG-OX) 400 MG tablet Take 400 mg by mouth daily.     No current facility-administered medications for this visit.     Allergies  Allergen Reactions  . Prednisone Other (See Comments)    Stroke     Social History  Social History  . Marital status: Divorced    Spouse name: N/A  . Number of children: N/A  . Years of education: N/A   Occupational History  . Not on file.   Social History Main Topics  . Smoking status: Former Smoker    Packs/day: 1.00    Types: Cigarettes    Quit date: 12/04/1994  . Smokeless tobacco: Not on file  . Alcohol use No  . Drug use: Unknown  . Sexual activity: Not on file   Other Topics Concern  . Not on file   Social History Narrative  . No narrative on file     Review of Systems: General: negative for chills, fever, night sweats or weight changes.  Cardiovascular: negative for chest pain, dyspnea on exertion, edema, orthopnea, palpitations, paroxysmal nocturnal dyspnea or shortness of breath Dermatological: negative for rash Respiratory: negative for cough or wheezing Urologic: negative for hematuria Abdominal: negative for nausea, vomiting, diarrhea, bright red blood per rectum,  melena, or hematemesis Neurologic: negative for visual changes, syncope, or dizziness All other systems reviewed and are otherwise negative except as noted above.    Blood pressure 140/74, pulse 63, height 6\' 1"  (1.854 m), weight 247 lb 6.4 oz (112.2 kg).  General appearance: alert, cooperative, no distress and moderately obese Neck: no JVD and LCE scar, bilateral carotid bruits Lungs: clear to auscultation bilaterally Heart: regular rate and rhythm and 2/6 systolic murmur AOV, LSB Extremities: no edema Skin: Skin color, texture, turgor normal. No rashes or lesions Neurologic: Grossly normal  EKG NSR, LVH NSST changes  ASSESSMENT AND PLAN:   Pre-operative cardiovascular examination Seen today for pre op clearance for proposed RCE  PVD- LCE Dec 2015 Now with progression in RICA  S/P CABG x 3- 2003 ([place]) Patent LIMA-LAD, patent SCG-OM, occluded SVG-RCA with patent stents in native RCA-Aug 2016. The pt denies any recent angina. He is followed at [facility].  ICM-EF 25% 2D 07/07/15 Admitted in Dec 2016 for CHF (we did not see then) but no problems since.   ICD in place -BS-(implanted July 2013 [place]) Followed at the [place]  PAF (paroxysmal atrial fibrillation) (HCC) Holding NSR-on Amiodarone, ASA, Plavix  History of CVA (cerebrovascular accident) [facility] 2015, no residual  OSA (obstructive sleep apnea) Compliant with C-pap  Aortic stenosis, mild Moderate to severe by echo, mild at cath Aug 2016   PLAN  I reviewed Eric Mcguire case with Dr Allyson Sabal in the office today. He feels the pt is an acceptable, though moderate risk from a cardiovascular standpoint, for proposed RCE. We will be available perioperatively for any issues. Long term cardiology follow up will be with the [place] in [place].   Corine Shelter PA-C 09/06/2016 2:01 PM

## 2016-09-06 NOTE — Assessment & Plan Note (Addendum)
Patent LIMA-LAD, patent SCG-OM, occluded SVG-RCA with patent stents in native RCA-Aug 2016. The pt denies any recent angina. [facility]

## 2016-09-06 NOTE — Assessment & Plan Note (Signed)
Holding NSR-on Amiodarone, ASA, Plavix

## 2016-09-06 NOTE — Assessment & Plan Note (Addendum)
[place] 2015, no residual

## 2016-09-06 NOTE — Patient Instructions (Signed)
Medication Instructions:  Your physician recommends that you continue on your current medications as directed. Please refer to the Current Medication list given to you today.   Labwork: none  Testing/Procedures: none  Follow-Up: Follow up with [facility] as needed  Any Other Special Instructions Will Be Listed Below (If Applicable).     If you need a refill on your cardiac medications before your next appointment, please call your pharmacy.

## 2016-09-12 ENCOUNTER — Encounter: Payer: Self-pay | Admitting: Vascular Surgery

## 2016-09-14 NOTE — Progress Notes (Signed)
Established Carotid Patient  History of Present Illness  Eric Mcguire is a 70 y.o. ([birth year]) male who presents cc: follow up from cardiologist, Dr. Allyson Sabal.  Pt was risk stratified as moderate but acceptable risk.  Pt has previous had a L CEA for a CVA.  He has been undergoing surveillance studies and recently his carotid studies demonstrated: RICA >80% stenosis, LICA patent CEA.  The patient has never had amaurosis fugax or monocular blindness.  The patient has never had facial drooping or hemiplegia.  He has had residual R hand weakness and contracture however.  The patient has never had receptive or expressive aphasia.     The patient's risks factors for carotid disease include: HTN, DM, smoking.   Past Medical History:  Diagnosis Date  . Aortic stenosis, severe 07/09/2015  . Carotid artery occlusion   . CKD (chronic kidney disease), stage II   . Coronary artery disease   . Hypertension   . ICD (implantable cardioverter-defibrillator), dual, in situ July 2013   BS implanted in [facility]  . Ischemic cardiomyopathy Aug 2016   EF 25%   . PAF (paroxysmal atrial fibrillation) (HCC)    not anticoagulated -hx GI bleed  . S/P CABG x 3 2002   [facility] [place]  . Stroke [facility]) Dec 2015   Lt brain, [place]  . Type 2 diabetes mellitus with renal manifestations [facility])     Past Surgical History:  Procedure Laterality Date  . CARDIAC CATHETERIZATION N/A 07/10/2015   Procedure: Right/Left Heart Cath and Coronary Angiography;  Surgeon: Corky Crafts, MD;  Location: [facility] INVASIVE CV LAB;  Service: Cardiovascular;  Laterality: N/A;  . CARDIAC SURGERY    . [place]  Dec 2015   [place]  . EP IMPLANTABLE DEVICE  July 2013   BS duel ICD  . PERIPHERAL VASCULAR CATHETERIZATION  07/10/2015   Procedure: Thoracic Aortogram;  Surgeon: Corky Crafts, MD;  Location: [facility] INVASIVE CV LAB;  Service: Cardiovascular;;    Social History   Social History  . Marital status: Divorced    Spouse name: N/A    . Number of children: N/A  . Years of education: N/A   Occupational History  . Not on file.   Social History Main Topics  . Smoking status: Former Smoker    Packs/day: 1.00    Types: Cigarettes    Quit date: 12/04/1994  . Smokeless tobacco: Not on file  . Alcohol use No  . Drug use: Unknown  . Sexual activity: Not on file   Other Topics Concern  . Not on file   Social History Narrative  . No narrative on file   Family History: patient is unable to detail the medical history of his parents   Current Outpatient Prescriptions  Medication Sig Dispense Refill  . acetaminophen (TYLENOL 8 HOUR) 650 MG CR tablet Take 1 tablet (650 mg total) by mouth every 8 (eight) hours as needed for pain. 30 tablet 0  . amiodarone (PACERONE) 200 MG tablet Take 200 mg by mouth daily.    Marland Kitchen amLODipine (NORVASC) 5 MG tablet Take 1 tablet (5 mg total) by mouth daily. 30 tablet 0  . aspirin 81 MG tablet Take 81 mg by mouth daily.    Marland Kitchen atorvastatin (LIPITOR) 80 MG tablet Take 80 mg by mouth daily.     . carvedilol (COREG) 3.125 MG tablet Take 3.125 mg by mouth 2 (two) times daily with a meal.    . cholecalciferol (VITAMIN D) 1000  UNITS tablet Take 1,000 Units by mouth daily.    . clopidogrel (PLAVIX) 75 MG tablet Take 75 mg by mouth daily.     . ferrous sulfate 325 (65 FE) MG tablet Take 1 tablet (325 mg total) by mouth 3 (three) times daily with meals. 90 tablet 0  . furosemide (LASIX) 40 MG tablet Take 40 mg by mouth 3 (three) times daily.    . insulin NPH-regular Human (NOVOLIN 70/30) (70-30) 100 UNIT/ML injection Inject 50-55 Units into the skin 2 (two) times daily. 45 units in the am and 40 units at night. (Patient taking differently: Inject 50-55 Units into the skin 2 (two) times daily. 55 units in the am and 50 units at night.)    . lisinopril (PRINIVIL,ZESTRIL) 5 MG tablet Take 1 tablet (5 mg total) by mouth daily. 30 tablet 0  . magnesium oxide (MAG-OX) 400 MG tablet Take 400 mg by mouth daily.      No current facility-administered medications for this visit.      Allergies  Allergen Reactions  . Prednisone Other (See Comments)    Stroke      REVIEW OF SYSTEMS:   Cardiac:  positive for: no symptoms, negative for: Chest pain or chest pressure, Shortness of breath upon exertion and Shortness of breath when lying flat,   Vascular:  positive for: no symptoms,  negative for: Pain in calf, thigh, or hip brought on by ambulation, Pain in feet at night that wakes you up from your sleep, Blood clot in your veins and Leg swelling  Pulmonary:  positive for: no symptoms,  negative for: Oxygen at home, Productive cough and Wheezing  Neurologic:  positive for: Sudden weakness in arms or legs, negative for: Sudden numbness in arms or legs, Sudden onset of difficulty speaking or slurred speech, Temporary loss of vision in one eye and Problems with dizziness  Gastrointestinal:  positive for: no symptoms, negative for: Blood in stool and Vomited blood  Genitourinary:  positive for: no symptoms, negative for: Burning when urinating and Blood in urine  Psychiatric:  positive for: no symptoms,  negative for: Major depression  Hematologic:  positive for: no symptoms,  negative for: negative for: Bleeding problems and Problems with blood clotting too easily  Dermatologic:  positive for: no symptoms, negative for: Rashes or ulcers  Constitutional:  positive for: no symptoms, negative for: Fever or chills   For VQI Use Only  PRE-ADM LIVING: Home  AMB STATUS: Ambulatory  CAD Sx: History of MI, but no symptoms No MI within 6 months  PRIOR CHF: None  STRESS TEST: No   Physical Examination  There were no vitals filed for this visit.  There is no height or weight on file to calculate BMI.   General: Alert, O x 3, WD,NAD  Eyes: PERRLA, EOMI,   Neck: Supple, mid-line trachea,    Pulmonary: Sym exp, good B air movt,CTA B  Cardiac: RRR,  Nl S1, S2, no Murmurs, No rubs, No S3,S4  Vascular: Vessel Right Left  Radial Palpable Palpable  Brachial Palpable Palpable  Carotid Palpable, No Bruit Palpable, No Bruit  Aorta Not palpable N/A  Femoral Palpable Palpable  Popliteal Not palpable Not palpable  PT Not palpable Not palpable  DP Not palpable Not palpable   Gastrointestinal: soft, non-distended, non-tender to palpation, No guarding or rebound, no HSM, no masses, no CVAT B, No palpable prominent aortic pulse,    Musculoskeletal: M/S 5/5 throughout except R hand deformity due to residual motor deficits,  Extremities without ischemic changes  , No edema present  , No LDS present  Neurologic: CN 2-12 intact , Pain and light touch intact in extremities , Motor exam as listed above   Non-Invasive Vascular Imaging  R CAROTID DUPLEX (Date: 08/26/2016):   R ICA stenosis: 80-99% (445/127 c/s)  R VA: patent and antegrade   Medical Decision Making  Eric Mcguire is a 70 y.o. (04/01/1946) male who presents with: asx R ICA stenosis >90%, CAD s/p CABG, known cardiomyopathy, mild AS   I have offered the patient: R CEA. I discussed with the patient the risks, benefits, and alternatives to carotid endarterectomy.   As Dr. Allyson SabalBerry deems the patient an acceptable surgical risk, I would proceed with carotid endarterectomy over carotid artery stenting based on the CREST I trial. I discussed the procedural details of carotid endarterectomy with the patient.   The patient is aware that the risks of carotid endarterectomy include but are not limited to: bleeding, infection, stroke, myocardial infarction, death, cranial nerve injuries both temporary and permanent, neck hematoma, possible airway compromise, labile blood pressure post-operatively, cerebral hyperperfusion syndrome, and possible need for additional interventions in the future.  The patient is aware of the risks and agrees to proceed forward with the procedure.  I  discussed in depth with the patient the nature of atherosclerosis, and emphasized the importance of maximal medical management including strict control of blood pressure, blood glucose, and lipid levels, obtaining regular exercise, antiplatelet agents, and cessation of smoking.    The patient is currently on a statin:  Lipitor.  The patient is currently on an anti-platelet: ASA.  The patient is aware that without maximal medical management the underlying atherosclerotic disease process will progress, limiting the benefit of any interventions.  Thank you for allowing us to participate in this patient's care.   Leonides SakeBrian Cassie Henkels, MD, FACS [facility] [place] Office: [phone] Pager: [phone]

## 2016-09-15 ENCOUNTER — Ambulatory Visit (INDEPENDENT_AMBULATORY_CARE_PROVIDER_SITE_OTHER): Payer: Non-veteran care | Admitting: Vascular Surgery

## 2016-09-15 ENCOUNTER — Other Ambulatory Visit: Payer: Self-pay

## 2016-09-15 VITALS — BP 120/59 | HR 71 | Temp 98.8°F | Resp 18 | Ht 73.0 in | Wt 241.0 lb

## 2016-09-15 DIAGNOSIS — I739 Peripheral vascular disease, unspecified: Principal | ICD-10-CM

## 2016-09-15 DIAGNOSIS — I779 Disorder of arteries and arterioles, unspecified: Secondary | ICD-10-CM | POA: Diagnosis not present

## 2016-09-15 DIAGNOSIS — I255 Ischemic cardiomyopathy: Secondary | ICD-10-CM

## 2016-09-16 ENCOUNTER — Encounter (HOSPITAL_COMMUNITY): Payer: Self-pay | Admitting: *Deleted

## 2016-09-18 NOTE — Anesthesia Preprocedure Evaluation (Addendum)
Anesthesia Evaluation  Patient identified by MRN, date of birth, ID band Patient awake    Reviewed: Allergy & Precautions, H&P , NPO status , Patient's Chart, lab work & pertinent test results  History of Anesthesia Complications Negative for: history of anesthetic complications  Airway Mallampati: III  TM Distance: >3 FB Neck ROM: full    Dental  (+) Edentulous Lower, Edentulous Upper   Pulmonary sleep apnea , former smoker,    Pulmonary exam normal breath sounds clear to auscultation       Cardiovascular hypertension, + CAD, + CABG, + Peripheral Vascular Disease and +CHF  Normal cardiovascular exam+ Cardiac Defibrillator + Valvular Problems/Murmurs AS  Rhythm:regular Rate:Normal  Echo with ICM with LVEF 25%, has ICD Also with mod-severe AS by echo, was mild by cath  Patient has cardiology clearance for moderate risk for MACE from Dr. Hazle CocaBerry's office   Neuro/Psych CVA    GI/Hepatic negative GI ROS, Neg liver ROS,   Endo/Other  diabetes, Type 2  Renal/GU Renal disease     Musculoskeletal   Abdominal   Peds  Hematology negative hematology ROS (+)   Anesthesia Other Findings ICD has never shocked him he says  Reproductive/Obstetrics negative OB ROS                          Anesthesia Physical Anesthesia Plan  ASA: III  Anesthesia Plan: General   Post-op Pain Management:    Induction: Intravenous  Airway Management Planned: Oral ETT  Additional Equipment: Arterial line  Intra-op Plan:   Post-operative Plan:   Informed Consent: I have reviewed the patients History and Physical, chart, labs and discussed the procedure including the risks, benefits and alternatives for the proposed anesthesia with the patient or authorized representative who has indicated his/her understanding and acceptance.   Dental Advisory Given  Plan Discussed with: Anesthesiologist, CRNA and  Surgeon  Anesthesia Plan Comments: (Would rec phenylephrine and remifentanyl infusions for case, maintain MAP for CPP given AS, HR goal 60-80)       Anesthesia Quick Evaluation

## 2016-09-19 ENCOUNTER — Inpatient Hospital Stay (HOSPITAL_COMMUNITY)
Admission: RE | Admit: 2016-09-19 | Discharge: 2016-09-20 | DRG: 038 | Disposition: A | Discharge status: Home / Self Care | Payer: Non-veteran care | Source: Ambulatory Visit | Attending: Vascular Surgery | Admitting: Vascular Surgery

## 2016-09-19 ENCOUNTER — Telehealth: Payer: Self-pay | Admitting: Vascular Surgery

## 2016-09-19 ENCOUNTER — Inpatient Hospital Stay (HOSPITAL_COMMUNITY): Payer: Non-veteran care | Admitting: Anesthesiology

## 2016-09-19 ENCOUNTER — Encounter (HOSPITAL_COMMUNITY): Payer: Self-pay | Admitting: *Deleted

## 2016-09-19 ENCOUNTER — Encounter (HOSPITAL_COMMUNITY)
Admission: RE | Disposition: A | Discharge status: Home / Self Care | Payer: Self-pay | Source: Ambulatory Visit | Attending: Vascular Surgery

## 2016-09-19 DIAGNOSIS — I739 Peripheral vascular disease, unspecified: Secondary | ICD-10-CM | POA: Diagnosis present

## 2016-09-19 DIAGNOSIS — G4733 Obstructive sleep apnea (adult) (pediatric): Secondary | ICD-10-CM | POA: Diagnosis present

## 2016-09-19 DIAGNOSIS — I480 Paroxysmal atrial fibrillation: Secondary | ICD-10-CM | POA: Diagnosis present

## 2016-09-19 DIAGNOSIS — Z9861 Coronary angioplasty status: Secondary | ICD-10-CM | POA: Diagnosis not present

## 2016-09-19 DIAGNOSIS — I6521 Occlusion and stenosis of right carotid artery: Principal | ICD-10-CM | POA: Diagnosis present

## 2016-09-19 DIAGNOSIS — E669 Obesity, unspecified: Secondary | ICD-10-CM | POA: Diagnosis present

## 2016-09-19 DIAGNOSIS — I2510 Atherosclerotic heart disease of native coronary artery without angina pectoris: Secondary | ICD-10-CM | POA: Diagnosis present

## 2016-09-19 DIAGNOSIS — Z6832 Body mass index (BMI) 32.0-32.9, adult: Secondary | ICD-10-CM

## 2016-09-19 DIAGNOSIS — I255 Ischemic cardiomyopathy: Secondary | ICD-10-CM | POA: Diagnosis present

## 2016-09-19 DIAGNOSIS — I5042 Chronic combined systolic (congestive) and diastolic (congestive) heart failure: Secondary | ICD-10-CM | POA: Diagnosis present

## 2016-09-19 DIAGNOSIS — I130 Hypertensive heart and chronic kidney disease with heart failure and stage 1 through stage 4 chronic kidney disease, or unspecified chronic kidney disease: Secondary | ICD-10-CM | POA: Diagnosis present

## 2016-09-19 DIAGNOSIS — I350 Nonrheumatic aortic (valve) stenosis: Secondary | ICD-10-CM | POA: Diagnosis present

## 2016-09-19 DIAGNOSIS — Z79899 Other long term (current) drug therapy: Secondary | ICD-10-CM | POA: Diagnosis not present

## 2016-09-19 DIAGNOSIS — Z951 Presence of aortocoronary bypass graft: Secondary | ICD-10-CM

## 2016-09-19 DIAGNOSIS — Z7982 Long term (current) use of aspirin: Secondary | ICD-10-CM | POA: Diagnosis not present

## 2016-09-19 DIAGNOSIS — Z8673 Personal history of transient ischemic attack (TIA), and cerebral infarction without residual deficits: Secondary | ICD-10-CM

## 2016-09-19 DIAGNOSIS — Z888 Allergy status to other drugs, medicaments and biological substances status: Secondary | ICD-10-CM | POA: Diagnosis not present

## 2016-09-19 DIAGNOSIS — Z23 Encounter for immunization: Secondary | ICD-10-CM | POA: Diagnosis not present

## 2016-09-19 DIAGNOSIS — F419 Anxiety disorder, unspecified: Secondary | ICD-10-CM | POA: Diagnosis present

## 2016-09-19 DIAGNOSIS — Z95810 Presence of automatic (implantable) cardiac defibrillator: Secondary | ICD-10-CM | POA: Diagnosis not present

## 2016-09-19 DIAGNOSIS — N182 Chronic kidney disease, stage 2 (mild): Secondary | ICD-10-CM | POA: Diagnosis present

## 2016-09-19 DIAGNOSIS — E1122 Type 2 diabetes mellitus with diabetic chronic kidney disease: Secondary | ICD-10-CM | POA: Diagnosis present

## 2016-09-19 DIAGNOSIS — I472 Ventricular tachycardia: Secondary | ICD-10-CM | POA: Diagnosis not present

## 2016-09-19 DIAGNOSIS — I5022 Chronic systolic (congestive) heart failure: Secondary | ICD-10-CM | POA: Diagnosis not present

## 2016-09-19 DIAGNOSIS — Z794 Long term (current) use of insulin: Secondary | ICD-10-CM

## 2016-09-19 DIAGNOSIS — I25810 Atherosclerosis of coronary artery bypass graft(s) without angina pectoris: Secondary | ICD-10-CM

## 2016-09-19 HISTORY — DX: Anxiety disorder, unspecified: F41.9

## 2016-09-19 HISTORY — PX: ENDARTERECTOMY: SHX5162

## 2016-09-19 LAB — COMPREHENSIVE METABOLIC PANEL
ALBUMIN: 3.5 g/dL (ref 3.5–5.0)
ALT: 25 U/L (ref 17–63)
ANION GAP: 9 (ref 5–15)
AST: 31 U/L (ref 15–41)
Alkaline Phosphatase: 79 U/L (ref 38–126)
BILIRUBIN TOTAL: 0.6 mg/dL (ref 0.3–1.2)
BUN: 22 mg/dL — ABNORMAL HIGH (ref 6–20)
CO2: 23 mmol/L (ref 22–32)
Calcium: 8.9 mg/dL (ref 8.9–10.3)
Chloride: 102 mmol/L (ref 101–111)
Creatinine, Ser: 1.2 mg/dL (ref 0.61–1.24)
GFR calc Af Amer: >60 mL/min (ref >60)
GFR, EST NON AFRICAN AMERICAN: 59 mL/min — AB (ref >60)
GLUCOSE: 277 mg/dL — AB (ref 65–99)
POTASSIUM: 4 mmol/L (ref 3.5–5.1)
Sodium: 134 mmol/L — ABNORMAL LOW (ref 135–145)
TOTAL PROTEIN: 7.4 g/dL (ref 6.5–8.1)

## 2016-09-19 LAB — ABO/RH: ABO/RH(D): O POS

## 2016-09-19 LAB — CBC
HEMATOCRIT: 41.1 % (ref 39.0–52.0)
Hemoglobin: 14 g/dL (ref 13.0–17.0)
MCH: 34.7 pg — ABNORMAL HIGH (ref 26.0–34.0)
MCHC: 34.1 g/dL (ref 30.0–36.0)
MCV: 101.7 fL — ABNORMAL HIGH (ref 78.0–100.0)
Platelets: 159 10*3/uL (ref 150–400)
RBC: 4.04 MIL/uL — ABNORMAL LOW (ref 4.22–5.81)
RDW: 13.5 % (ref 11.5–15.5)
WBC: 5.8 10*3/uL (ref 4.0–10.5)

## 2016-09-19 LAB — GLUCOSE, CAPILLARY
GLUCOSE-CAPILLARY: 309 mg/dL — AB (ref 65–99)
Glucose-Capillary: 226 mg/dL — ABNORMAL HIGH (ref 65–99)
Glucose-Capillary: 268 mg/dL — ABNORMAL HIGH (ref 65–99)
Glucose-Capillary: 329 mg/dL — ABNORMAL HIGH (ref 65–99)

## 2016-09-19 LAB — MAGNESIUM: Magnesium: 1.9 mg/dL (ref 1.7–2.4)

## 2016-09-19 LAB — PROTIME-INR
INR: 1.09
PROTHROMBIN TIME: 14.1 s (ref 11.4–15.2)

## 2016-09-19 LAB — SURGICAL PCR SCREEN
MRSA, PCR: NEGATIVE
Staphylococcus aureus: NEGATIVE

## 2016-09-19 LAB — TYPE AND SCREEN
ABO/RH(D): O POS
ANTIBODY SCREEN: NEGATIVE

## 2016-09-19 LAB — TROPONIN I
TROPONIN I: 0.08 ng/mL — AB (ref <0.03)
Troponin I: 0.06 ng/mL (ref <0.03)

## 2016-09-19 LAB — APTT: APTT: 30 s (ref 24–36)

## 2016-09-19 SURGERY — ENDARTERECTOMY, CAROTID
Anesthesia: General | Site: Neck | Laterality: Right

## 2016-09-19 MED ORDER — AMLODIPINE BESYLATE 5 MG PO TABS
5.0000 mg | ORAL_TABLET | Freq: Every day | ORAL | Status: DC
  Administered 2016-09-19 – 2016-09-20 (×2): 5 mg via ORAL
  Filled 2016-09-19 (×2): qty 1

## 2016-09-19 MED ORDER — CLOPIDOGREL BISULFATE 75 MG PO TABS
75.0000 mg | ORAL_TABLET | Freq: Every day | ORAL | Status: DC
  Administered 2016-09-20: 75 mg via ORAL
  Filled 2016-09-19: qty 1

## 2016-09-19 MED ORDER — SODIUM CHLORIDE 0.9 % IV SOLN: INTRAVENOUS | Status: DC

## 2016-09-19 MED ORDER — LISINOPRIL 5 MG PO TABS
5.0000 mg | ORAL_TABLET | Freq: Every day | ORAL | Status: DC
  Administered 2016-09-19 – 2016-09-20 (×2): 5 mg via ORAL
  Filled 2016-09-19 (×2): qty 1

## 2016-09-19 MED ORDER — CHLORHEXIDINE GLUCONATE CLOTH 2 % EX PADS: 6.0000 | MEDICATED_PAD | Freq: Once | CUTANEOUS | Status: DC

## 2016-09-19 MED ORDER — FERROUS SULFATE 325 (65 FE) MG PO TABS
325.0000 mg | ORAL_TABLET | Freq: Three times a day (TID) | ORAL | Status: DC
  Administered 2016-09-19 – 2016-09-20 (×2): 325 mg via ORAL
  Filled 2016-09-19 (×2): qty 1

## 2016-09-19 MED ORDER — LIDOCAINE HCL (CARDIAC) 20 MG/ML IV SOLN
INTRAVENOUS | Status: DC | PRN
  Administered 2016-09-19: 100 mg via INTRAVENOUS

## 2016-09-19 MED ORDER — ONDANSETRON HCL 4 MG/2ML IJ SOLN
INTRAMUSCULAR | Status: DC | PRN
  Administered 2016-09-19: 4 mg via INTRAVENOUS

## 2016-09-19 MED ORDER — FUROSEMIDE 40 MG PO TABS
40.0000 mg | ORAL_TABLET | Freq: Three times a day (TID) | ORAL | Status: DC
  Administered 2016-09-19 – 2016-09-20 (×3): 40 mg via ORAL
  Filled 2016-09-19 (×3): qty 1

## 2016-09-19 MED ORDER — MORPHINE SULFATE (PF) 2 MG/ML IV SOLN: 2.0000 mg | INTRAVENOUS | Status: DC | PRN

## 2016-09-19 MED ORDER — PHENOL 1.4 % MT LIQD: 1.0000 | OROMUCOSAL | Status: DC | PRN

## 2016-09-19 MED ORDER — VITAMIN D 1000 UNITS PO TABS
1000.0000 [IU] | ORAL_TABLET | Freq: Every day | ORAL | Status: DC
  Administered 2016-09-19 – 2016-09-20 (×2): 1000 [IU] via ORAL
  Filled 2016-09-19 (×2): qty 1

## 2016-09-19 MED ORDER — SODIUM CHLORIDE 0.9 % IV SOLN
INTRAVENOUS | Status: DC | PRN
  Administered 2016-09-19: .075 ug/kg/min via INTRAVENOUS

## 2016-09-19 MED ORDER — FENTANYL CITRATE (PF) 100 MCG/2ML IJ SOLN
INTRAMUSCULAR | Status: AC
  Filled 2016-09-19: qty 2

## 2016-09-19 MED ORDER — FENTANYL CITRATE (PF) 100 MCG/2ML IJ SOLN
INTRAMUSCULAR | Status: DC | PRN
  Administered 2016-09-19: 100 ug via INTRAVENOUS

## 2016-09-19 MED ORDER — PHENYLEPHRINE HCL 10 MG/ML IJ SOLN
INTRAMUSCULAR | Status: DC | PRN
  Administered 2016-09-19: 80 ug via INTRAVENOUS
  Administered 2016-09-19 (×2): 160 ug via INTRAVENOUS
  Administered 2016-09-19: 240 ug via INTRAVENOUS
  Administered 2016-09-19: 80 ug via INTRAVENOUS

## 2016-09-19 MED ORDER — OXYCODONE-ACETAMINOPHEN 5-325 MG PO TABS
1.0000 | ORAL_TABLET | ORAL | Status: DC | PRN
  Administered 2016-09-20: 1 via ORAL
  Filled 2016-09-19: qty 1

## 2016-09-19 MED ORDER — ROCURONIUM BROMIDE 100 MG/10ML IV SOLN
INTRAVENOUS | Status: DC | PRN
  Administered 2016-09-19: 10 mg via INTRAVENOUS
  Administered 2016-09-19: 40 mg via INTRAVENOUS

## 2016-09-19 MED ORDER — REMIFENTANIL HCL 1 MG IV SOLR
0.0125 ug/kg/min | INTRAVENOUS | Status: DC
  Filled 2016-09-19: qty 2000

## 2016-09-19 MED ORDER — MAGNESIUM SULFATE 2 GM/50ML IV SOLN: 2.0000 g | Freq: Every day | INTRAVENOUS | Status: DC | PRN

## 2016-09-19 MED ORDER — PHENYLEPHRINE HCL 10 MG/ML IJ SOLN
INTRAVENOUS | Status: DC | PRN
  Administered 2016-09-19: 10:00:00 via INTRAVENOUS
  Administered 2016-09-19: 100 ug/min via INTRAVENOUS

## 2016-09-19 MED ORDER — SODIUM CHLORIDE 0.9 % IV SOLN
INTRAVENOUS | Status: DC
  Administered 2016-09-19: 16:00:00 via INTRAVENOUS

## 2016-09-19 MED ORDER — INSULIN ASPART 100 UNIT/ML ~~LOC~~ SOLN
0.0000 [IU] | Freq: Three times a day (TID) | SUBCUTANEOUS | Status: DC
Start: 2016-09-19 — End: 2016-09-20

## 2016-09-19 MED ORDER — PANTOPRAZOLE SODIUM 40 MG PO TBEC
40.0000 mg | DELAYED_RELEASE_TABLET | Freq: Every day | ORAL | Status: DC
  Administered 2016-09-19 – 2016-09-20 (×2): 40 mg via ORAL
  Filled 2016-09-19 (×2): qty 1

## 2016-09-19 MED ORDER — INSULIN ASPART 100 UNIT/ML ~~LOC~~ SOLN
SUBCUTANEOUS | Status: AC
  Filled 2016-09-19: qty 1

## 2016-09-19 MED ORDER — DEXTROSE 5 % IV SOLN
1.5000 g | INTRAVENOUS | Status: AC
  Administered 2016-09-19: 1.5 g via INTRAVENOUS
  Filled 2016-09-19: qty 1.5

## 2016-09-19 MED ORDER — ONDANSETRON HCL 4 MG/2ML IJ SOLN: 4.0000 mg | Freq: Four times a day (QID) | INTRAMUSCULAR | Status: DC | PRN

## 2016-09-19 MED ORDER — DOCUSATE SODIUM 100 MG PO CAPS
100.0000 mg | ORAL_CAPSULE | Freq: Every day | ORAL | Status: DC
  Administered 2016-09-20: 100 mg via ORAL
  Filled 2016-09-19: qty 1

## 2016-09-19 MED ORDER — 0.9 % SODIUM CHLORIDE (POUR BTL) OPTIME
TOPICAL | Status: DC | PRN
  Administered 2016-09-19: 2000 mL

## 2016-09-19 MED ORDER — ATORVASTATIN CALCIUM 80 MG PO TABS
80.0000 mg | ORAL_TABLET | Freq: Every day | ORAL | Status: DC
  Administered 2016-09-19 – 2016-09-20 (×2): 80 mg via ORAL
  Filled 2016-09-19 (×2): qty 1

## 2016-09-19 MED ORDER — PROPOFOL 10 MG/ML IV BOLUS
INTRAVENOUS | Status: DC | PRN
  Administered 2016-09-19 (×2): 50 mg via INTRAVENOUS

## 2016-09-19 MED ORDER — MAGNESIUM OXIDE 400 (241.3 MG) MG PO TABS
400.0000 mg | ORAL_TABLET | Freq: Every day | ORAL | Status: DC
  Administered 2016-09-19 – 2016-09-20 (×2): 400 mg via ORAL
  Filled 2016-09-19 (×2): qty 1

## 2016-09-19 MED ORDER — INFLUENZA VAC SPLIT QUAD 0.5 ML IM SUSY
0.5000 mL | PREFILLED_SYRINGE | INTRAMUSCULAR | Status: AC
  Administered 2016-09-20: 0.5 mL via INTRAMUSCULAR
  Filled 2016-09-19: qty 0.5

## 2016-09-19 MED ORDER — MUPIROCIN 2 % EX OINT
1.0000 "application " | TOPICAL_OINTMENT | Freq: Once | CUTANEOUS | Status: AC
  Administered 2016-09-19: 1 via TOPICAL
  Filled 2016-09-19: qty 22

## 2016-09-19 MED ORDER — HEMOSTATIC AGENTS (NO CHARGE) OPTIME
TOPICAL | Status: DC | PRN
  Administered 2016-09-19: 1 via TOPICAL

## 2016-09-19 MED ORDER — LACTATED RINGERS IV SOLN
INTRAVENOUS | Status: DC | PRN
  Administered 2016-09-19 (×2): via INTRAVENOUS

## 2016-09-19 MED ORDER — PROPOFOL 10 MG/ML IV BOLUS
INTRAVENOUS | Status: AC
  Filled 2016-09-19: qty 20

## 2016-09-19 MED ORDER — BISACODYL 10 MG RE SUPP: 10.0000 mg | Freq: Every day | RECTAL | Status: DC | PRN

## 2016-09-19 MED ORDER — HEPARIN SODIUM (PORCINE) 1000 UNIT/ML IJ SOLN
INTRAMUSCULAR | Status: DC | PRN
  Administered 2016-09-19: 11000 [IU] via INTRAVENOUS

## 2016-09-19 MED ORDER — ACETAMINOPHEN 325 MG PO TABS: 650.0000 mg | ORAL_TABLET | Freq: Three times a day (TID) | ORAL | Status: DC | PRN

## 2016-09-19 MED ORDER — SODIUM CHLORIDE 0.9 % IV SOLN: 500.0000 mL | Freq: Once | INTRAVENOUS | Status: DC | PRN

## 2016-09-19 MED ORDER — GLYCOPYRROLATE 0.2 MG/ML IJ SOLN
INTRAMUSCULAR | Status: DC | PRN
  Administered 2016-09-19: 0.2 mg via INTRAVENOUS

## 2016-09-19 MED ORDER — FENTANYL CITRATE (PF) 100 MCG/2ML IJ SOLN
25.0000 ug | INTRAMUSCULAR | Status: DC | PRN
  Administered 2016-09-19 (×2): 25 ug via INTRAVENOUS

## 2016-09-19 MED ORDER — CARVEDILOL 3.125 MG PO TABS
3.1250 mg | ORAL_TABLET | Freq: Once | ORAL | Status: AC
  Administered 2016-09-19: 3.125 mg via ORAL
  Filled 2016-09-19: qty 1

## 2016-09-19 MED ORDER — HYDRALAZINE HCL 20 MG/ML IJ SOLN: 5.0000 mg | INTRAMUSCULAR | Status: DC | PRN

## 2016-09-19 MED ORDER — LIDOCAINE HCL (PF) 1 % IJ SOLN
INTRAMUSCULAR | Status: AC
  Filled 2016-09-19: qty 30

## 2016-09-19 MED ORDER — LABETALOL HCL 5 MG/ML IV SOLN: 10.0000 mg | INTRAVENOUS | Status: DC | PRN

## 2016-09-19 MED ORDER — ONDANSETRON HCL 4 MG/2ML IJ SOLN: 4.0000 mg | Freq: Once | INTRAMUSCULAR | Status: DC | PRN

## 2016-09-19 MED ORDER — ESMOLOL HCL 100 MG/10ML IV SOLN
INTRAVENOUS | Status: AC
  Filled 2016-09-19: qty 10

## 2016-09-19 MED ORDER — ALUM & MAG HYDROXIDE-SIMETH 200-200-20 MG/5ML PO SUSP: 15.0000 mL | ORAL | Status: DC | PRN

## 2016-09-19 MED ORDER — MAGNESIUM HYDROXIDE 400 MG/5ML PO SUSP: 30.0000 mL | Freq: Every day | ORAL | Status: DC | PRN

## 2016-09-19 MED ORDER — CARVEDILOL 3.125 MG PO TABS
3.1250 mg | ORAL_TABLET | Freq: Two times a day (BID) | ORAL | Status: DC
  Administered 2016-09-19 – 2016-09-20 (×2): 3.125 mg via ORAL
  Filled 2016-09-19 (×2): qty 1

## 2016-09-19 MED ORDER — SODIUM CHLORIDE 0.9 % IV SOLN
INTRAVENOUS | Status: DC | PRN
  Administered 2016-09-19: 500 mL

## 2016-09-19 MED ORDER — POTASSIUM CHLORIDE CRYS ER 20 MEQ PO TBCR: 20.0000 meq | EXTENDED_RELEASE_TABLET | Freq: Every day | ORAL | Status: DC | PRN

## 2016-09-19 MED ORDER — DEXTRAN 40 IN SALINE 10-0.9 % IV SOLN
INTRAVENOUS | Status: AC
  Filled 2016-09-19: qty 500

## 2016-09-19 MED ORDER — DEXTRAN 40 IN SALINE 10-0.9 % IV SOLN
INTRAVENOUS | Status: DC | PRN
  Administered 2016-09-19: 1093 mL

## 2016-09-19 MED ORDER — SUCCINYLCHOLINE CHLORIDE 20 MG/ML IJ SOLN
INTRAMUSCULAR | Status: DC | PRN
  Administered 2016-09-19: 100 mg via INTRAVENOUS

## 2016-09-19 MED ORDER — DEXTROSE 5 % IV SOLN
1.5000 g | Freq: Two times a day (BID) | INTRAVENOUS | Status: AC
  Administered 2016-09-19 – 2016-09-20 (×2): 1.5 g via INTRAVENOUS
  Filled 2016-09-19 (×2): qty 1.5

## 2016-09-19 MED ORDER — METOPROLOL TARTRATE 5 MG/5ML IV SOLN: 2.0000 mg | INTRAVENOUS | Status: DC | PRN

## 2016-09-19 MED ORDER — PROTAMINE SULFATE 10 MG/ML IV SOLN
INTRAVENOUS | Status: DC | PRN
  Administered 2016-09-19 (×3): 10 mg via INTRAVENOUS
  Administered 2016-09-19: 20 mg via INTRAVENOUS

## 2016-09-19 MED ORDER — SUGAMMADEX SODIUM 200 MG/2ML IV SOLN
INTRAVENOUS | Status: DC | PRN
  Administered 2016-09-19: 200 mg via INTRAVENOUS

## 2016-09-19 MED ORDER — ASPIRIN EC 81 MG PO TBEC
81.0000 mg | DELAYED_RELEASE_TABLET | Freq: Every day | ORAL | Status: DC
  Administered 2016-09-20: 81 mg via ORAL
  Filled 2016-09-19: qty 1

## 2016-09-19 MED ORDER — GUAIFENESIN-DM 100-10 MG/5ML PO SYRP: 15.0000 mL | ORAL_SOLUTION | ORAL | Status: DC | PRN

## 2016-09-19 MED ORDER — AMIODARONE HCL 200 MG PO TABS
200.0000 mg | ORAL_TABLET | Freq: Every day | ORAL | Status: DC
  Administered 2016-09-19 – 2016-09-20 (×2): 200 mg via ORAL
  Filled 2016-09-19 (×2): qty 1

## 2016-09-19 SURGICAL SUPPLY — 46 items
BAG DECANTER FOR FLEXI CONT (MISCELLANEOUS) ×3 IMPLANT
CANISTER SUCTION 2500CC (MISCELLANEOUS) ×3 IMPLANT
CATH ROBINSON RED A/P 18FR (CATHETERS) ×3 IMPLANT
CLIP TI MEDIUM 6 (CLIP) ×3 IMPLANT
CLIP TI WIDE RED SMALL 6 (CLIP) ×6 IMPLANT
COVER PROBE W GEL 5X96 (DRAPES) ×3 IMPLANT
CRADLE DONUT ADULT HEAD (MISCELLANEOUS) ×3 IMPLANT
DERMABOND ADVANCED (GAUZE/BANDAGES/DRESSINGS) ×2
DERMABOND ADVANCED .7 DNX12 (GAUZE/BANDAGES/DRESSINGS) ×1 IMPLANT
ELECT REM PT RETURN 9FT ADLT (ELECTROSURGICAL) ×3
ELECTRODE REM PT RTRN 9FT ADLT (ELECTROSURGICAL) ×1 IMPLANT
GLOVE BIO SURGEON STRL SZ 6.5 (GLOVE) ×8 IMPLANT
GLOVE BIO SURGEON STRL SZ7 (GLOVE) ×3 IMPLANT
GLOVE BIO SURGEONS STRL SZ 6.5 (GLOVE) ×4
GLOVE BIOGEL PI IND STRL 6.5 (GLOVE) ×4 IMPLANT
GLOVE BIOGEL PI IND STRL 7.5 (GLOVE) ×1 IMPLANT
GLOVE BIOGEL PI INDICATOR 6.5 (GLOVE) ×8
GLOVE BIOGEL PI INDICATOR 7.5 (GLOVE) ×2
GLOVE SS N UNI LF 6.5 STRL (GLOVE) ×6 IMPLANT
GOWN STRL REUS W/ TWL LRG LVL3 (GOWN DISPOSABLE) ×6 IMPLANT
GOWN STRL REUS W/TWL LRG LVL3 (GOWN DISPOSABLE) ×12
HEMOSTAT SPONGE AVITENE ULTRA (HEMOSTASIS) ×3 IMPLANT
IV ADAPTER SYR DOUBLE MALE LL (MISCELLANEOUS) IMPLANT
KIT BASIN OR (CUSTOM PROCEDURE TRAY) ×3 IMPLANT
KIT ROOM TURNOVER OR (KITS) ×3 IMPLANT
NEEDLE HYPO 25GX1X1/2 BEV (NEEDLE) IMPLANT
NS IRRIG 1000ML POUR BTL (IV SOLUTION) ×9 IMPLANT
PACK CAROTID (CUSTOM PROCEDURE TRAY) ×3 IMPLANT
PAD ARMBOARD 7.5X6 YLW CONV (MISCELLANEOUS) ×6 IMPLANT
SET COLLECT BLD 21X3/4 12 PB (MISCELLANEOUS) ×3 IMPLANT
SHUNT CAROTID BYPASS 10 (VASCULAR PRODUCTS) IMPLANT
SHUNT CAROTID BYPASS 12FRX15.5 (VASCULAR PRODUCTS) IMPLANT
STOPCOCK 4 WAY LG BORE MALE ST (IV SETS) ×3 IMPLANT
SUT ETHILON 3 0 PS 1 (SUTURE) IMPLANT
SUT MNCRL AB 4-0 PS2 18 (SUTURE) ×3 IMPLANT
SUT PROLENE 6 0 BV (SUTURE) ×9 IMPLANT
SUT PROLENE 7 0 BV 1 (SUTURE) ×3 IMPLANT
SUT SILK 3 0 TIES 17X18 (SUTURE)
SUT SILK 3-0 18XBRD TIE BLK (SUTURE) IMPLANT
SUT VIC AB 3-0 SH 27 (SUTURE) ×2
SUT VIC AB 3-0 SH 27X BRD (SUTURE) ×1 IMPLANT
SYR TB 1ML LUER SLIP (SYRINGE) IMPLANT
SYSTEM CHEST DRAIN TLS 7FR (DRAIN) IMPLANT
TUBING ART PRESS 48 MALE/FEM (TUBING) ×3 IMPLANT
TUBING EXTENTION W/L.L. (IV SETS) ×3 IMPLANT
WATER STERILE IRR 1000ML POUR (IV SOLUTION) ×3 IMPLANT

## 2016-09-19 NOTE — Anesthesia Postprocedure Evaluation (Signed)
Anesthesia Post Note  Patient: Eric HartDonald Mcguire  Procedure(s) Performed: Procedure(s) (LRB): RIGHT CAROTID ENDARTERECTOMY (Right)  Patient location during evaluation: PACU Anesthesia Type: General Level of consciousness: awake and alert Pain management: pain level controlled Vital Signs Assessment: post-procedure vital signs reviewed and stable Respiratory status: spontaneous breathing, nonlabored ventilation, respiratory function stable and patient connected to nasal cannula oxygen Cardiovascular status: blood pressure returned to baseline and stable Postop Assessment: no signs of nausea or vomiting Anesthetic complications: no Comments: ICD turned back on in pacu.. Did well during the case    Last Vitals:  Vitals:   09/19/16 1300 09/19/16 1330  BP: (!) 111/52 (!) 118/52  Pulse: 60 (!) 59  Resp: 15 16  Temp: 36.4 C     Last Pain:  Vitals:   09/19/16 1330  TempSrc:   PainSc: Asleep                 Eric Mcguire, Eric Mcguire

## 2016-09-19 NOTE — Transfer of Care (Signed)
Immediate Anesthesia Transfer of Care Note  Patient: Eric Mcguire  Procedure(s) Performed: Procedure(s): RIGHT CAROTID ENDARTERECTOMY (Right)  Patient Location: PACU  Anesthesia Type:General  Level of Consciousness: awake, alert  and oriented  Airway & Oxygen Therapy: Patient Spontanous Breathing and Patient connected to nasal cannula oxygen  Post-op Assessment: Report given to RN, Post -op Vital signs reviewed and stable, Patient moving all extremities X 4 and Patient able to stick tongue midline  Post vital signs: Reviewed and stable  Last Vitals:  Vitals:   09/19/16 0620  BP: (!) 152/66  Pulse: 70  Resp: 18  Temp: 36.4 C    Last Pain:  Vitals:   09/19/16 0620  TempSrc: Oral      Patients Stated Pain Goal: 3 (09/19/16 0723)  Complications: No apparent anesthesia complications

## 2016-09-19 NOTE — Progress Notes (Signed)
       Patient sitting up in the bedside chair.  He states he is comfortable. Smile is symmetric and no tongue deviation  S/P right CEA Disposition stable  Branndon Tuite MAUREEN PA-C

## 2016-09-19 NOTE — Progress Notes (Signed)
Inpatient Diabetes Program Recommendations  AACE/ADA: New Consensus Statement on Inpatient Glycemic Control (2015)  Target Ranges:  Prepandial:   less than 140 mg/dL      Peak postprandial:   less than 180 mg/dL (1-2 hours)      Critically ill patients:  140 - 180 mg/dL   Lab Results  Component Value Date   GLUCAP 226 (H) 09/19/2016   HGBA1C 6.6 (H) 07/07/2015    Review of Glycemic Control  Diabetes history: DM 2 Outpatient Diabetes medications: 70/30 55 units QAM, 50 units QPM Current orders for Inpatient glycemic control: Novolog Resistant TID  Inpatient Diabetes Program Recommendations:   Patient takes 70/30 with basal insulin component at home. Glucose in the 200-300 range here. Patient still currently NPO. Please consider starting low dose basal insulin while inpatient, Lantus 30 units (almost 0.3units/kg) Q24hrs.  Thanks, Christena DeemShannon Reginaldo Hazard RN, MSN, [facility] Inpatient Diabetes Coordinator Team Pager [phone] (8a-5p)

## 2016-09-19 NOTE — Progress Notes (Signed)
Discussed with pt his elevated blood sugar. Pt states he does not cover his blood sugar at night because he drops over night. Pt told to let the nurse know if he feels his blood sugar is dropping.

## 2016-09-19 NOTE — Progress Notes (Signed)
Pt states that maybe he should get blood sugar covered. MD is paged.

## 2016-09-19 NOTE — Anesthesia Procedure Notes (Signed)
Procedure Name: Intubation Date/Time: 09/19/2016 8:15 AM Performed by: Carmela Rima Pre-anesthesia Checklist: Timeout performed, Patient being monitored, Suction available, Emergency Drugs available and Patient identified Patient Re-evaluated:Patient Re-evaluated prior to inductionOxygen Delivery Method: Circle system utilized Preoxygenation: Pre-oxygenation with 100% oxygen Intubation Type: IV induction and Rapid sequence Ventilation: Mask ventilation without difficulty Laryngoscope Size: Mac and 4 Grade View: Grade I Tube type: Oral Tube size: 7.5 mm Number of attempts: 1 Placement Confirmation: breath sounds checked- equal and bilateral,  positive ETCO2 and ETT inserted through vocal cords under direct vision Secured at: 23 cm Tube secured with: Tape Dental Injury: Teeth and Oropharynx as per pre-operative assessment

## 2016-09-19 NOTE — Telephone Encounter (Signed)
-----   Message from Sharee PimpleMarilyn K McChesney, RN sent at 09/19/2016  1:15 PM EDT ----- Regarding: schedule 2 weeks post CEA   ----- Message ----- From: Dara LordsSamantha J Rhyne, PA-C Sent: 09/19/2016  11:19 AM To: [facility] Charge Pool  S/p right CEA 09/19/16.   F/u with Imogene Burnhen in 2 weeks.  Thanks

## 2016-09-19 NOTE — Interval H&P Note (Signed)
History and Physical Interval Note:  09/19/2016 7:19 AM  Eric Mcguire  has presented today for surgery, with the diagnosis of Right carotid artery stenosis I65.21  The various methods of treatment have been discussed with the patient and family. After consideration of risks, benefits and other options for treatment, the patient has consented to  Procedure(s): ENDARTERECTOMY CAROTID (Right) as a surgical intervention .  The patient's history has been reviewed, patient examined, no change in status, stable for surgery.  I have reviewed the patient's chart and labs.  Questions were answered to the patient's satisfaction.     Leonides SakeBrian Alohilani Levenhagen

## 2016-09-19 NOTE — H&P (View-Only) (Signed)
09/06/2016 Eric Mcguire   04/22/1946  [id number]  Primary Physician Eric GrebeSEKHON, MANHARPRETT, MD Primary Cardiologist: [facility] ( Dr Eric Mcguire when admitted here)  HPI:  70 y/o obese AA [place] [facility] [place] with a history of CAD-s/p CABG and PCI, PVD, ICM, s/p ICD, and OSA on C-pap. He had a CAB[place] x 3 in [place] in 2003. He subsequently had a PCI (? Details). In 2013 he had a BS ICD placed in [place]. In Dec 2015 he had a CVA and LCE in [place]. We last saw the pt in Aug 2016 when he was admitted with CHF. There was concern the pt had severe AS and it was decided to proceed with Rt and Lt heart cath. This revealed patent L-LAD and S-OM. The S-RCA was occluded with patent stents in the native RCA. The AS turned out to be not be significant. [facility] [place] [facility] [place]. He was admitted in Dec 2016 for CHF but we did not see him during that admission. Echo Dec 2016 showed his EF to be 35%- (again with moderate to severe AS, mild at cath 3 months earlier).   Dr Eric Mcguire has followed the pt for carotid disease. He has noted progression in his RICA to > 80% and the pt is here today for pre op cardiovascular clearance. The pt denies any chest pain or NTG use. He says he can walk up a flight of stairs "no problem". He has not been hospitalized for CHF since Dec 2016 and has had no unusual dyspnea.    Current Outpatient Prescriptions  Medication Sig Dispense Refill  . acetaminophen (TYLENOL 8 HOUR) 650 MG CR tablet Take 1 tablet (650 mg total) by mouth every 8 (eight) hours as needed for pain. 30 tablet 0  . amiodarone (PACERONE) 200 MG tablet Take 200 mg by mouth daily.    Marland Kitchen. amLODipine (NORVASC) 5 MG tablet Take 1 tablet (5 mg total) by mouth daily. 30 tablet 0  . aspirin 81 MG tablet Take 81 mg by mouth daily.    Marland Kitchen. atorvastatin (LIPITOR) 80 MG tablet Take 80 mg by mouth daily.     . carvedilol (COREG) 3.125 MG tablet Take 3.125 mg by mouth 2 (two) times daily with a meal.      . cholecalciferol (VITAMIN D) 1000 UNITS tablet Take 1,000 Units by mouth daily.    . clopidogrel (PLAVIX) 75 MG tablet Take 75 mg by mouth daily.     . ferrous sulfate 325 (65 FE) MG tablet Take 1 tablet (325 mg total) by mouth 3 (three) times daily with meals. 90 tablet 0  . furosemide (LASIX) 40 MG tablet Take 40 mg by mouth 3 (three) times daily.    . insulin NPH-regular Human (NOVOLIN 70/30) (70-30) 100 UNIT/ML injection Inject 50-55 Units into the skin 2 (two) times daily. 45 units in the am and 40 units at night. (Patient taking differently: Inject 50-55 Units into the skin 2 (two) times daily. 55 units in the am and 50 units at night.)    . lisinopril (PRINIVIL,ZESTRIL) 5 MG tablet Take 1 tablet (5 mg total) by mouth daily. 30 tablet 0  . magnesium oxide (MAG-OX) 400 MG tablet Take 400 mg by mouth daily.     No current facility-administered medications for this visit.     Allergies  Allergen Reactions  . Prednisone Other (See Comments)    Stroke     Social History  Social History  . Marital status: Divorced    Spouse name: N/A  . Number of children: N/A  . Years of education: N/A   Occupational History  . Not on file.   Social History Main Topics  . Smoking status: Former Smoker    Packs/day: 1.00    Types: Cigarettes    Quit date: 12/04/1994  . Smokeless tobacco: Not on file  . Alcohol use No  . Drug use: Unknown  . Sexual activity: Not on file   Other Topics Concern  . Not on file   Social History Narrative  . No narrative on file     Review of Systems: General: negative for chills, fever, night sweats or weight changes.  Cardiovascular: negative for chest pain, dyspnea on exertion, edema, orthopnea, palpitations, paroxysmal nocturnal dyspnea or shortness of breath Dermatological: negative for rash Respiratory: negative for cough or wheezing Urologic: negative for hematuria Abdominal: negative for nausea, vomiting, diarrhea, bright red blood per rectum,  melena, or hematemesis Neurologic: negative for visual changes, syncope, or dizziness All other systems reviewed and are otherwise negative except as noted above.    Blood pressure 140/74, pulse 63, height 6\' 1"  (1.854 m), weight 247 lb 6.4 oz (112.2 kg).  General appearance: alert, cooperative, no distress and moderately obese Neck: no JVD and LCE scar, bilateral carotid bruits Lungs: clear to auscultation bilaterally Heart: regular rate and rhythm and 2/6 systolic murmur AOV, LSB Extremities: no edema Skin: Skin color, texture, turgor normal. No rashes or lesions Neurologic: Grossly normal  EKG NSR, LVH NSST changes  ASSESSMENT AND PLAN:   Pre-operative cardiovascular examination Seen today for pre op clearance for proposed RCE  PVD- LCE Dec 2015 Now with progression in RICA  S/P CABG x 3- 2003 ([place]) Patent LIMA-LAD, patent SCG-OM, occluded SVG-RCA with patent stents in native RCA-Aug 2016. The pt denies any recent angina. He is followed at [facility].  ICM-EF 25% 2D 07/07/15 Admitted in Dec 2016 for CHF (we did not see then) but no problems since.   ICD in place -BS-(implanted July 2013 [place]) Followed at the [place]  PAF (paroxysmal atrial fibrillation) (HCC) Holding NSR-on Amiodarone, ASA, Plavix  History of CVA (cerebrovascular accident) [facility] 2015, no residual  OSA (obstructive sleep apnea) Compliant with C-pap  Aortic stenosis, mild Moderate to severe by echo, mild at cath Aug 2016   PLAN  I reviewed Eric Mcguire's case with Dr Eric Sabal in the office today. He feels the pt is an acceptable, though moderate risk from a cardiovascular standpoint, for proposed RCE. We will be available perioperatively for any issues. Long term cardiology follow up will be with the [place] in [place].   Corine Shelter PA-C 09/06/2016 2:01 PM

## 2016-09-19 NOTE — Telephone Encounter (Signed)
No VM set up, mailing letter for appt on 11/3 @ 12:45

## 2016-09-19 NOTE — Op Note (Signed)
OPERATIVE NOTE  PROCEDURE:   1.  right carotid endarterectomy with primary repair 2.  right intraoperative carotid ultrasound  PRE-OPERATIVE DIAGNOSIS: right asymptomatic carotid stenosis >80 %  POST-OPERATIVE DIAGNOSIS: same as above   SURGEON: Leonides Sake, MD  ASSISTANT(S): Doreatha Massed, PAC   ANESTHESIA: general  ESTIMATED BLOOD LOSS: 50 cc  FINDING(S): 1.  Continuous Doppler audible flow signatures are appropriate for each carotid artery. 2.  No evidence of intimal flap visualized on transverse or longitudinal ultrasonography. 3.  Internal carotid plaque: necrotic core, hemrrhagic plaque, near occluded 4.  Common carotid artery: distal plaque was necrotic core 4.  Vagus nerve: normal position 5.  Hypoglossal nerve: visualized, required mobilization off artery as adherent to artery  SPECIMEN(S):  none  INDICATIONS:   Eric Mcguire is a 70 y.o. male who presents with right asymptomatic carotid stenosis >80%.  I discussed with the patient the risks, benefits, and alternatives to carotid endarterectomy.  I discussed the procedural details of carotid endarterectomy with the patient.  The patient is aware that the risks of carotid endarterectomy include but are not limited to: bleeding, infection, stroke, myocardial infarction, death, cranial nerve injuries both temporary and permanent, neck hematoma, possible airway compromise, labile blood pressure post-operatively, cerebral hyperperfusion syndrome, and possible need for additional interventions in the future. The patient is aware of the risks and agrees to proceed forward with the procedure.   DESCRIPTION: After full informed written consent was obtained from the patient, the patient was brought back to the operating room and placed supine upon the operating table.  Prior to induction, the patient received IV antibiotics.  After obtaining adequate anesthesia, the patient was placed into semi-Fowler position with a shoulder  roll in place and the patient's neck slightly hyperextended and rotated away from the surgical site.    The patient was prepped in the standard fashion for a right carotid endarterectomy.  I made an incision anterior to the sternocleidomastoid muscle and dissected down through the subcutaneous tissue.  The platysmas was opened with electrocautery.  Then I dissected down to the internal jugular vein.  This was dissected posteriorly until I obtained visualization of the common carotid artery.  This was dissected out and then an umbilical tape was placed around the common carotid artery and I loosely applied a Rumel tourniquet.  I then dissected in a periadventitial fashion along the common carotid artery up to the bifurcation.  I then identified the external carotid artery and the superior thyroid artery.  A 2-0 silk tie was looped around the superior thyroid artery, and I also dissected out the external carotid artery and placed a vessel loop around it.  In continuing the dissection to the internal carotid artery, I identified the facial vein.  This was ligated and then transected, giving me improved exposure of the internal carotid artery.  In the process of this dissection, the hypoglossal nerve was identified.  This was densely adherent to this artery, so some sharp and blunt dissection was necessary to mobilize the nerve off the internal carotid artery.  I did not visualize any injury of the hypoglossal nerve.    I then dissected out the internal carotid artery until I identified an area of soft tissue in the internal carotid artery.  Due to the high carotid bifurcation, I did not think a Rumel tourniquet could be easily applied to the internal carotid artery.   I dissected slightly distal to relatively disease free portion of the internal carotid artery and  placed a vessel loop around the artery.   At this point, the patient was given a therapeutic bolus of Heparin intravenously, 11000 units (roughly 100  units/kg).  After waiting 3 minutes, then I clamped the external carotid artery and then the common carotid artery.  Using a butterfly needle connected to the arterial pressure circuit, I cannulated the common carotid artery distal to the common carotid clamp.  The stump pressure was measured at: 90 mm Hg (MAP).  Based on this measurement, I felt no shunt was needed.    I then made an arteriotomy in the common carotid artery with a 11 blade, and extended the arteriotomy with a Potts scissor down into the common carotid artery.  I had to extend the arteriotomy more proximally in the common carotid artery that planned, due to some necrotic plaque in the common carotid artery.  I then carried the arteriotomy through the bifurcation into the internal carotid artery until I reached an area that was not diseased.  At this point, I started the endarterectomy in the common carotid artery with a Cytogeneticist and carried this dissection down into the common carotid artery circumferentially.  Then I transected the plaque at a segment where it was adherent.  I then carried this dissection up into the external carotid artery.  The plaque was extracted by unclamping the external carotid artery and everting the artery.  The dissection was then carried into the internal carotid artery, extracting the remaining portion of the carotid plaque.  I passed the plaque off the field as a specimen.  I then spent the next 30 minutes removing intimal flaps and loose debris.  Eventually I reached the point where the residual plaque was densely adherent and any further dissection would compromise the integrity of the wall.  After verifying that there was no more loose intimal flaps or debris, I re-interrogated the entirety of this carotid artery.  At this point, I was satisfied that the minimal remaining disease was densely adherent to the wall and wall integrity was intact.  Given the large size of the common carotid artery, I felt  patching this artery would make the artery aneurysmal, so I repaired this arteriotomy with two running stitches of 6-0 Prolene, running from each end of the incision.  Prior to completing this primary repair, I backbled the external carotid artery and then reclamped it.  Back bleeding was: acceptable.   I then backbled the internal carotid artery and then reclamped it.  Back bleeding was: excellent.   Finally, I backbled the common carotid artery and then reclamped it.  Back bleeding was: pulsatile.   I completed the patch angioplasty in the usual fashion.    At this point, I first released the clamp on the external carotid artery, then I released it on the common carotid artery.  After waiting a few seconds, I then released it on the internal carotid artery.  I then interrogated this patient's arteries with the continuous Doppler.  The audible waveforms in each artery were consistent with the expected characteristics for each artery.  The Sonosite probe was then sterilely draped and used to interrogate the carotid artery in both longitudinal and transverse views.  There was no intimal flap noted in either view.  At this point, I washed out the wound, and placed Avitene throughout.  I also gave the patient 50 mg of protamine to reverse his anticoagulation.   After waiting a few minutes, I removed the Avitene and washed out the  wound.  There was no more active bleeding in the surgical site.   I then reapproximated the platysma muscle with a running stitch of 3-0 Vicryl.  The skin was then reapproximated with a running subcuticular 4-0 Monocryl stitch.  The skin was then cleaned, dried and Dermabond was used to reinforce the skin closure.    The patient woke without any problems, neurologically intact.      COMPLICATIONS: none  CONDITION: stable  Leonides SakeBrian France Lusty, MD, [facility] Office: [phone] Pager: [phone]  09/19/2016, 11:29 AM

## 2016-09-19 NOTE — Consult Note (Addendum)
CARDIOLOGY CONSULT NOTE   Patient ID: Eric Mcguire MRN: [id number] DOB/AGE: [birth year] 70 y.o.  Admit date: 09/19/2016  Requesting Physician: Dr. Imogene Burn Primary Physician:   Dorene Grebe, MD Primary Cardiologist:  Lenn Sink- ( Dr Allyson Sabal when admitted here) Reason for Consultation: NSVT after carotid endarterectomy   HPI: Eric Mcguire is a 70 y.o. male with a history of CVA, obesity, CAD-s/p CABG x3V (2003 in [place]) and subsequent PCI, ICM/chronic systolic CHF s/p BS ICD, PVD, s/p ICD, OSA on C-pap, PAF on amiodarone and carotid artery disease s/p CEA today. Noted to have some NSVT after procedure today and cardiology is consulted.     He had a CABG x 3 in [place] in 2003. He subsequently had a PCI (? Details). In 2013 he had a BS ICD placed in [place]. In Dec 2015 he had a CVA and LCE in [place]. We last saw the pt in Aug 2016 when he was admitted with CHF. There was concern the pt had severe AS and it was decided to proceed with Rt and Lt heart cath. This revealed patent L-LAD and S-OM. The S-RCA was occluded with patent stents in the native RCA. The AS turned out to be not be significant. He has since followed up with the [place] in [place]. He was admitted in 10/2015 for CHF but we did not see him during that admission. Echo Dec 2016 showed his EF to be 35%- (again with moderate to severe AS, mild at cath 3 months earlier).   Dr. Imogene Burn has followed the pt for carotid disease. He has noted progression in his RICA to > 80% needed CEA. He was seen by Corine Shelter PA-C in the office on 09/06/16 for pre op clearance. He was cleared without further testing.   No chest pain or SOB. No complaints, just wants "to put his feet on the floor." No LE edema, orthopnea or PND. No dizziness or syncope. No palpitations.     Past Medical History:  Diagnosis Date  . Anxiety   . Aortic stenosis, severe 07/09/2015  . Carotid artery occlusion   . CKD (chronic kidney disease), stage II   . Coronary  artery disease   . Hypertension   . ICD (implantable cardioverter-defibrillator), dual, in situ July 2013   BS [facility] in [facility]  . Ischemic cardiomyopathy Aug 2016   EF 25%   . PAF (paroxysmal atrial fibrillation) (HCC)    not anticoagulated -hx GI bleed  . S/P CABG x 3 2002   [facility] [place]  . Stroke [facility]) Dec 2015   Lt brain, [place]  . Type 2 diabetes mellitus with renal manifestations (HCC)    type II     Past Surgical History:  Procedure Laterality Date  . CARDIAC CATHETERIZATION N/A 07/10/2015   Procedure: Right/Left Heart Cath and Coronary Angiography;  Surgeon: Corky Crafts, MD;  Location: [facility] INVASIVE CV LAB;  Service: Cardiovascular;  Laterality: N/A;  . CARDIAC SURGERY    . CAROTID [place]ECTOMY  Dec 2015   [place]  . EP IMPLANTABLE DEVICE  July 2013   BS duel ICD  . PERIPHERAL VASCULAR CATHETERIZATION  07/10/2015   Procedure: Thoracic Aortogram;  Surgeon: Corky Crafts, MD;  Location: [facility] INVASIVE CV LAB;  Service: Cardiovascular;;    Allergies  Allergen Reactions  . Prednisone Other (See Comments)    Stroke     I have reviewed the patient's current medications . amiodarone  200 mg Oral Daily  . amLODipine  5 mg Oral Daily  . [START ON 09/20/2016] aspirin EC  81 mg Oral Daily  . atorvastatin  80 mg Oral Daily  . carvedilol  3.125 mg Oral BID WC  . cefUROXime (ZINACEF)  IV  1.5 g Intravenous Q12H  . cholecalciferol  1,000 Units Oral Daily  . [START ON 09/20/2016] clopidogrel  75 mg Oral Daily  . [START ON 09/20/2016] docusate sodium  100 mg Oral Daily  . fentaNYL      . ferrous sulfate  325 mg Oral TID WC  . furosemide  40 mg Oral TID  . insulin aspart  0-20 Units Subcutaneous TID WC  . lisinopril  5 mg Oral Daily  . magnesium oxide  400 mg Oral Daily  . pantoprazole  40 mg Oral Daily   . sodium chloride     sodium chloride, acetaminophen, alum & mag hydroxide-simeth, bisacodyl, guaiFENesin-dextromethorphan, hydrALAZINE, labetalol, magnesium hydroxide,  magnesium sulfate 1 - 4 g bolus IVPB, metoprolol, morphine injection, ondansetron, oxyCODONE-acetaminophen, phenol, potassium chloride  Prior to Admission medications   Medication Sig Start Date End Date Taking? Authorizing Provider  acetaminophen (TYLENOL 8 HOUR) 650 MG CR tablet Take 1 tablet (650 mg total) by mouth every 8 (eight) hours as needed for pain. 01/28/15  Yes Kaitlyn Szekalski, PA-C  amiodarone (PACERONE) 200 MG tablet Take 200 mg by mouth daily.   Yes Historical Provider, MD  amLODipine (NORVASC) 5 MG tablet Take 1 tablet (5 mg total) by mouth daily. 11/09/15  Yes Rhetta MuraJai-Gurmukh Samtani, MD  aspirin EC 81 MG tablet Take 81 mg by mouth daily.   Yes Historical Provider, MD  atorvastatin (LIPITOR) 80 MG tablet Take 80 mg by mouth daily.    Yes Historical Provider, MD  carvedilol (COREG) 3.125 MG tablet Take 3.125 mg by mouth 2 (two) times daily with a meal.   Yes Historical Provider, MD  cholecalciferol (VITAMIN D) 1000 UNITS tablet Take 1,000 Units by mouth daily.   Yes Historical Provider, MD  clopidogrel (PLAVIX) 75 MG tablet Take 75 mg by mouth daily.    Yes Historical Provider, MD  ferrous sulfate 325 (65 FE) MG tablet Take 1 tablet (325 mg total) by mouth 3 (three) times daily with meals. 05/17/15  Yes Garlon HatchetLisa M Sanders, PA-C  furosemide (LASIX) 40 MG tablet Take 40 mg by mouth 3 (three) times daily.   Yes Historical Provider, MD  insulin NPH-regular Human (NOVOLIN 70/30) (70-30) 100 UNIT/ML injection Inject 50-55 Units into the skin 2 (two) times daily. 45 units in the am and 40 units at night. Patient taking differently: Inject 50-55 Units into the skin 2 (two) times daily. 55 units in the am and 50 units at night. 07/13/15  Yes Zannie CovePreetha Joseph, MD  lisinopril (PRINIVIL,ZESTRIL) 5 MG tablet Take 1 tablet (5 mg total) by mouth daily. 07/13/15  Yes Zannie CovePreetha Joseph, MD  magnesium oxide (MAG-OX) 400 MG tablet Take 400 mg by mouth daily.   Yes Historical Provider, MD     Social History    Social History  . Marital status: Divorced    Spouse name: N/A  . Number of children: N/A  . Years of education: N/A   Occupational History  . Not on file.   Social History Main Topics  . Smoking status: Former Smoker    Packs/day: 1.00    Years: 15.00    Types: Cigarettes    Quit date: 12/04/1994  . Smokeless tobacco: Former NeurosurgeonUser  . Alcohol use No  . Drug use: Unknown  Comment: reports of history of  . Sexual activity: Not on file   Other Topics Concern  . Not on file   Social History Narrative  . No narrative on file    No family status information on file.   History reviewed. No pertinent family history.    ROS:  Full 14 point review of systems complete and found to be negative unless listed above.  Physical Exam: Blood pressure (!) 131/56, pulse 62, temperature 97.8 F (36.6 C), resp. rate (!) 21, weight 241 lb (109.3 kg), SpO2 96 %.  General: Well developed, well nourished, male in no acute distress Head: Eyes PERRLA, No xanthomas.   Normocephalic and atraumatic, oropharynx without edema or exudate. Dentition:  Lungs: CTAB Heart: HRRR S1 S2, no rub/gallop, Heart regular rate and rhythm with S1, S2  murmur. pulses are 2+ extrem.   Neck: No carotid bruits. No lymphadenopathy. No JVD. Abdomen: Bowel sounds present, abdomen soft and non-tender without masses or hernias noted. Msk:  No spine or cva tenderness. No weakness, no joint deformities or effusions. Extremities: No clubbing or cyanosis. No LE edema.  Neuro: Alert and oriented X 3. No focal deficits noted. Psych:  Good affect, responds appropriately Skin: No rashes or lesions noted.  Labs:   Lab Results  Component Value Date   WBC 5.8 09/19/2016   HGB 14.0 09/19/2016   HCT 41.1 09/19/2016   MCV 101.7 (H) 09/19/2016   PLT 159 09/19/2016    Recent Labs  09/19/16 0713  INR 1.09     Recent Labs Lab 09/19/16 0713  NA 134*  K 4.0  CL 102  CO2 23  BUN 22*  CREATININE 1.20  CALCIUM 8.9   PROT 7.4  BILITOT 0.6  ALKPHOS 79  ALT 25  AST 31  GLUCOSE 277*  ALBUMIN 3.5   Magnesium  Date Value Ref Range Status  11/09/2015 1.8 1.7 - 2.4 mg/dL Final    Recent Labs  69/62/95 1131  TROPONINI 0.08*   No results for input(s): TROPIPOC in the last 72 hours. No results found for: PROBNP Lab Results  Component Value Date   CHOL 99 07/08/2015   HDL 31 (L) 07/08/2015   LDLCALC 58 07/08/2015   TRIG 49 07/08/2015   No results found for: DDIMER No results found for: LIPASE, AMYLASE TSH  Date/Time Value Ref Range Status  11/03/2015 03:48 AM 5.538 (H) 0.350 - 4.500 uIU/mL Final   T3, Free  Date/Time Value Ref Range Status  11/03/2015 03:48 AM 2.1 2.0 - 4.4 pg/mL Final    Comment:    (NOTE) Performed At: [facility] [address] [place], [place] [id number] Mila Homer MD NU:[id number]    No results found for: VITAMINB12, FOLATE, FERRITIN, TIBC, IRON, RETICCTPCT  Echo: 11/03/2015 LV EF: 35% Study Conclusions - Left ventricle: Poor image quality even with contrast. Diffuse   hypokinesis worse in the inferior wall. The cavity size was   moderately dilated. Wall thickness was increased in a pattern of   moderate LVH. The estimated ejection fraction was 35%. - Aortic valve: Calcified with restricted motion. Given degree of   LV dysfunction and morphology overall likely moderate to severe   AS and mild AR. Valve area (VTI): 0.81 cm^2. Valve area (Vmax):   0.68 cm^2. Valve area (Vmean): 0.83 cm^2. - Mitral valve: There was mild regurgitation. - Left atrium: The atrium was moderately dilated. - Tricuspid valve: There was moderate regurgitation. - Pulmonary arteries: PA peak pressure: 40 mm Hg (  S).  ECG:  Atrial-paced rhythm with prolonged AV conduction, HR 60. LVH, ILBBB  Radiology:  No results found.  ASSESSMENT AND PLAN:    Active Problems:   Asymptomatic carotid artery stenosis, right  NSVT: on amio 200mg  daily for afib. Continue Coreg  3.125mg  BID . K is fine. Will check Mag. He has an ICD in place. We will interrogate his [employer] ICD.   Carotid artery disease: s/p L CEA Dec 2015 and now s/p R CEA today   S/P CABG x 3- 2003 ([place]): Patent LIMA-LAD, patent SCG-OM, occluded SVG-RCA with patent stents in native RCA-Aug 2016. The pt denies any recent angina. He is followed at [facility].  ICM-EF 25% 2D 07/07/15: Admitted in Dec 2016 for CHF but no problems since. Appears euvolemic.  ICD in place -BS-(implanted July 2013 [place]) Followed at the [place]  PAF (paroxysmal atrial fibrillation) (HCC) Holding NSR-on Amiodarone, ASA, Plavix. CHADSVASC score of at least 6 (CHF, HTN, age, [place] Burow, CVA). Not clear why he is not on oral anticoagulation. Followed at [facility]  History of CVA (cerebrovascular accident) [facility] 2015, no residual defects   OSA (obstructive sleep apnea) Compliant with C-pap  Aortic stenosis, mild Moderate to severe by echo, mild at cath Aug 2016  Troponin Elevation: mild, 0.08. No chest pain. Known CAD, likely demand from stress of surgery. Continue to cycle  Signed: Cline Crock, PA-C 09/19/2016 3:01 PM  Pager [phone]  Co-Sign MD Patient seen and examined and history reviewed. Agree with above findings and plan. Called to see patient post op right CEA with NSVT. Patient without symptoms. No rhythm strips of tachycardia recorded. Patient has an ICD in place. Chronic ischemic cardiomyopathy. Background arrhythmia burden unknown since he is followed at the [place]. Patient is on chronic amiodarone and Coreg.   We will interrogate ICD to assess arrhythmia burden. He is already on appropriate therapy. Potassium is normal. Check magnesium level.   Peter Swaziland, MDFACC 09/19/2016 3:16 PM  Addendum: ICD interrogated. No VT seen. Rate limit at 175. Not sure what was seen before but suspect it could have been artifact. No further concerns. Resume prior cardiac meds. Follow up at [facility]. Call with  questions.  Peter Swaziland MD, [facility]

## 2016-09-20 ENCOUNTER — Encounter (HOSPITAL_COMMUNITY): Payer: Self-pay | Admitting: Vascular Surgery

## 2016-09-20 LAB — GLUCOSE, CAPILLARY: Glucose-Capillary: 270 mg/dL — ABNORMAL HIGH (ref 65–99)

## 2016-09-20 LAB — URINE MICROSCOPIC-ADD ON

## 2016-09-20 LAB — CBC
HEMATOCRIT: 40.9 % (ref 39.0–52.0)
HEMOGLOBIN: 13.3 g/dL (ref 13.0–17.0)
MCH: 34.2 pg — ABNORMAL HIGH (ref 26.0–34.0)
MCHC: 32.5 g/dL (ref 30.0–36.0)
MCV: 105.1 fL — AB (ref 78.0–100.0)
Platelets: 158 10*3/uL (ref 150–400)
RBC: 3.89 MIL/uL — ABNORMAL LOW (ref 4.22–5.81)
RDW: 14 % (ref 11.5–15.5)
WBC: 7.2 10*3/uL (ref 4.0–10.5)

## 2016-09-20 LAB — BASIC METABOLIC PANEL
ANION GAP: 8 (ref 5–15)
BUN: 19 mg/dL (ref 6–20)
CALCIUM: 8.6 mg/dL — AB (ref 8.9–10.3)
CHLORIDE: 99 mmol/L — AB (ref 101–111)
CO2: 27 mmol/L (ref 22–32)
Creatinine, Ser: 1.23 mg/dL (ref 0.61–1.24)
GFR calc non Af Amer: 57 mL/min — ABNORMAL LOW (ref >60)
GLUCOSE: 271 mg/dL — AB (ref 65–99)
Potassium: 4.3 mmol/L (ref 3.5–5.1)
Sodium: 134 mmol/L — ABNORMAL LOW (ref 135–145)

## 2016-09-20 LAB — URINALYSIS, ROUTINE W REFLEX MICROSCOPIC
BILIRUBIN URINE: NEGATIVE
HGB URINE DIPSTICK: NEGATIVE
Ketones, ur: NEGATIVE mg/dL
Leukocytes, UA: NEGATIVE
Nitrite: NEGATIVE
PH: 5.5 (ref 5.0–8.0)
Protein, ur: NEGATIVE mg/dL
SPECIFIC GRAVITY, URINE: 1.014 (ref 1.005–1.030)

## 2016-09-20 LAB — TROPONIN I: Troponin I: 0.06 ng/mL (ref <0.03)

## 2016-09-20 MED ORDER — OXYCODONE-ACETAMINOPHEN 5-325 MG PO TABS: 1.0000 | ORAL_TABLET | Freq: Four times a day (QID) | ORAL | 0 refills | Status: DC | PRN

## 2016-09-20 MED ORDER — INSULIN ASPART PROT & ASPART (70-30 MIX) 100 UNIT/ML ~~LOC~~ SUSP: 55.0000 [IU] | Freq: Every day | SUBCUTANEOUS | Status: DC

## 2016-09-20 MED ORDER — OXYCODONE-ACETAMINOPHEN 5-325 MG PO TABS: 1.0000 | ORAL_TABLET | Freq: Four times a day (QID) | ORAL | 0 refills | Status: AC | PRN

## 2016-09-20 MED ORDER — INSULIN NPH ISOPHANE & REGULAR (70-30) 100 UNIT/ML ~~LOC~~ SUSP: 50.0000 [IU] | Freq: Two times a day (BID) | SUBCUTANEOUS | Status: AC

## 2016-09-20 MED ORDER — INSULIN ASPART PROT & ASPART (70-30 MIX) 100 UNIT/ML ~~LOC~~ SUSP
45.0000 [IU] | Freq: Every day | SUBCUTANEOUS | Status: DC
  Administered 2016-09-20: 45 [IU] via SUBCUTANEOUS
  Filled 2016-09-20: qty 10

## 2016-09-20 NOTE — OR Nursing (Signed)
LATE ENTRY: No implant utilized on procedure; removed implant information.

## 2016-09-20 NOTE — Progress Notes (Signed)
Taxi called for pt pickup - provided pt with education and discharge instructions.  Answered all questions.

## 2016-09-20 NOTE — Care Management Note (Signed)
Case Management Note  Patient Details  Name: Eric HartDonald Mcguire MRN: [id number] Date of Birth: 09/05/1946  Subjective/Objective:   S/p R CEA, pta indep, patient is for dc today, he needs  Assistance with transportation, CSW provided cab voucher for patient.  No other needs.                  Action/Plan:   Expected Discharge Date:                  Expected Discharge Plan:  Home/Self Care  In-House Referral:  Clinical Social Work  Discharge planning Services  CM Consult  Post Acute Care Choice:    Choice offered to:     DME Arranged:    DME Agency:     HH Arranged:    HH Agency:     Status of Service:  Completed, signed off  If discussed at [employer] Meetings, dates discussed:    Additional Comments:  Leone Havenaylor, Alice Burnside Clinton, RN 09/20/2016, 10:50 AM

## 2016-09-20 NOTE — Discharge Summary (Signed)
Discharge Summary     Eric Mcguire 10/22/1946 70 y.o. male  [id number]  Admission Date: 09/19/2016  Discharge Date: 09/20/16  Physician: Fransisco HertzBrian L Chen, MD  Admission Diagnosis: Right carotid artery stenosis I65.21   HPI:   This is a 70 y.o. male  followed by the [facility] in [place] with a history of CAD-s/p CABG and PCI, PVD, ICM, s/p ICD, and OSA on C-pap. He had a CABG x 3 in [place] in 2003. He subsequently had a PCI (? Details). In 2013 he had a BS ICD placed in [place]. In Dec 2015 he had a CVA and LCE in [place]. We last saw the pt in Aug 2016 when he was admitted with CHF. There was concern the pt had severe AS and it was decided to proceed with Rt and Lt heart cath. This revealed patent L-LAD and S-OM. The S-RCA was occluded with patent stents in the native RCA. The AS turned out to be not be significant. He has since followed up with the [facility] in [place]. He was admitted in Dec 2016 for CHF but we did not see him during that admission. Echo Dec 2016 showed his EF to be 35%- (again with moderate to severe AS, mild at cath 3 months earlier).   Dr Imogene Burnhen has followed the pt for carotid disease. He has noted progression in his RICA to > 80% and the pt is here today for pre op cardiovascular clearance. The pt denies any chest pain or NTG use. He says he can walk up a flight of stairs "no problem". He has not been hospitalized for CHF since Dec 2016 and has had no unusual dyspnea.   Hospital Course:  The patient was admitted to the hospital and taken to the operating room on 09/19/2016 and underwent right carotid endarterectomy.  The pt tolerated the procedure well and was transported to the PACU in good condition.  Intraoperative Findings: FINDING(S): 1.  Continuous Doppler audible flow signatures are appropriate for each carotid artery. 2.  No evidence of intimal flap visualized on transverse or longitudinal ultrasonography. 3.  Internal carotid plaque: necrotic core, hemrrhagic plaque,  near occluded 4.  Common carotid artery: distal plaque was necrotic core 4.  Vagus nerve: normal position 5.  Hypoglossal nerve: visualized, required mobilization off artery as adherent to artery  Intraoperatively, he appeared to have some V-tach with drop in blood pressure a couple of times.  A cardiology consult was obtained.  Troponins were drawn with the first being 0.08 and subsequent two were 0.06.    Cardiology Patient seen and examined and history reviewed. Agree with above findings and plan. Called to see patient post op right CEA with NSVT. Patient without symptoms. No rhythm strips of tachycardia recorded. Patient has an ICD in place. Chronic ischemic cardiomyopathy. Background arrhythmia burden unknown since he is followed at the [facility]. Patient is on chronic amiodarone and Coreg.   We will interrogate ICD to assess arrhythmia burden. He is already on appropriate therapy. Potassium is normal. Check magnesium level.   Peter SwazilandJordan, MDFACC 09/19/2016 3:16 PM  Addendum: ICD interrogated. No VT seen. Rate limit at 175. Not sure what was seen before but suspect it could have been artifact. No further concerns. Resume prior cardiac meds. [facility] with questions.   By POD 1, the pt neuro status he is doing well.  He is neurologically in tact with no tongue deviation.  He was weaned off his O2 and swallowing okay.  He is started back on his home dose of insulin.  The remainder of the hospital course consisted of increasing mobilization and increasing intake of solids without difficulty.    Recent Labs  09/19/16 0713 09/20/16 0500  NA 134* 134*  K 4.0 4.3  CL 102 99*  CO2 23 27  GLUCOSE 277* 271*  BUN 22* 19  CALCIUM 8.9 8.6*    Recent Labs  09/19/16 0713 09/20/16 0500  WBC 5.8 7.2  HGB 14.0 13.3  HCT 41.1 40.9  PLT 159 158    Recent Labs  09/19/16 0713  INR 1.09     Discharge Instructions    CAROTID Sugery: Call MD for difficulty swallowing or  speaking; weakness in arms or legs that is a new symtom; severe headache.  If you have increased swelling in the neck and/or  are having difficulty breathing, CALL 911    Complete by:  As directed    Call MD for:  redness, tenderness, or signs of infection (pain, swelling, bleeding, redness, odor or green/yellow discharge around incision site)    Complete by:  As directed    Call MD for:  severe or increased pain, loss or decreased feeling  in affected limb(s)    Complete by:  As directed    Call MD for:  temperature >100.5    Complete by:  As directed    Discharge wound care:    Complete by:  As directed    Shower daily with soap and water starting 09/21/16   Driving Restrictions    Complete by:  As directed    No driving for 2 weeks   Lifting restrictions    Complete by:  As directed    No lifting for 2 weeks   Resume previous diet    Complete by:  As directed       Discharge Diagnosis:  Right carotid artery stenosis I65.21  Secondary Diagnosis: Patient Active Problem List   Diagnosis Date Noted  . Asymptomatic carotid artery stenosis, right 09/19/2016  . Pre-operative cardiovascular examination 09/06/2016  . Aortic stenosis, mild 09/06/2016  . Carotid artery disease (HCC) 08/26/2016  . Acute renal failure superimposed on stage 3 chronic kidney disease (HCC) 11/03/2015  . CHF exacerbation (HCC) 11/03/2015  . OSA (obstructive sleep apnea) 11/03/2015  . PAF (paroxysmal atrial fibrillation) (HCC) 11/03/2015  . Acute respiratory failure (HCC) 11/03/2015  . Aortic sclerosis (HCC) 07/09/2015  . Hyperthyroidism   . S/P CABG x 3- 2003 ([place]) 07/08/2015  . Type 2 diabetes with nephropathy (HCC) 07/08/2015  . History of CVA (cerebrovascular accident) 07/08/2015  . Essential hypertension 07/08/2015  . Dyslipidemia 07/08/2015  . Renal insufficiency-stage 3- presumably chronic 07/08/2015  . ICD in place -BS-(implanted July 2013 [place]) 07/08/2015  . Low TSH level 07/08/2015  . ICM-EF  25% 2D 07/07/15 07/08/2015  . Diastolic dysfunction-grade 2 07/08/2015  . Pulmonary hyperinflation-moderate 07/08/2015  . PVD- LCE Dec 2015 07/08/2015  . Acute on chronic combined systolic and diastolic congestive heart failure (HCC) 07/07/2015  . SOB (shortness of breath) 07/07/2015   Past Medical History:  Diagnosis Date  . Anxiety   . Aortic stenosis, severe 07/09/2015  . Carotid artery occlusion   . CKD (chronic kidney disease), stage II   . Coronary artery disease   . Hypertension   . ICD (implantable cardioverter-defibrillator), dual, in situ July 2013   BS implanted in [facility]  . Ischemic cardiomyopathy Aug 2016   EF 25%   . PAF (paroxysmal atrial fibrillation) (  HCC)    not anticoagulated -hx GI bleed  . [facility] [place]  . Stroke [facility]) Dec 2015   Lt brain, [place]  . Type 2 diabetes mellitus with renal manifestations (HCC)    type II      Medication List    TAKE these medications   acetaminophen 650 MG CR tablet Commonly known as:  TYLENOL 8 HOUR Take 1 tablet (650 mg total) by mouth every 8 (eight) hours as needed for pain.   amiodarone 200 MG tablet Commonly known as:  PACERONE Take 200 mg by mouth daily.   amLODipine 5 MG tablet Commonly known as:  NORVASC Take 1 tablet (5 mg total) by mouth daily.   aspirin EC 81 MG tablet Take 81 mg by mouth daily.   atorvastatin 80 MG tablet Commonly known as:  LIPITOR Take 80 mg by mouth daily.   carvedilol 3.125 MG tablet Commonly known as:  COREG Take 3.125 mg by mouth 2 (two) times daily with a meal.   cholecalciferol 1000 units tablet Commonly known as:  VITAMIN D Take 1,000 Units by mouth daily.   clopidogrel 75 MG tablet Commonly known as:  PLAVIX Take 75 mg by mouth daily.   ferrous sulfate 325 (65 FE) MG tablet Take 1 tablet (325 mg total) by mouth 3 (three) times daily with meals.   furosemide 40 MG tablet Commonly known as:  LASIX Take 40 mg by mouth 3 (three) times daily.   insulin  NPH-regular Human (70-30) 100 UNIT/ML injection Commonly known as:  NOVOLIN 70/30 Inject 50-55 Units into the skin 2 (two) times daily. 55 units in the am and 50 units at night. What changed:  additional instructions   lisinopril 5 MG tablet Commonly known as:  PRINIVIL,ZESTRIL Take 1 tablet (5 mg total) by mouth daily.   magnesium oxide 400 MG tablet Commonly known as:  MAG-OX Take 400 mg by mouth daily.   oxyCODONE-acetaminophen 5-325 MG tablet Commonly known as:  PERCOCET/ROXICET Take 1 tablet by mouth every 6 (six) hours as needed for moderate pain.       Prescriptions given: Percocet#6 No Refill  Instructions: 1.  Shower daily with soap and water starting 09/21/16 2.  No driving or heavy lifting x 2 weeks   Disposition: home  Patient's condition: is Good  Follow up: 1. Dr. Imogene Burn in 2 weeks.   Doreatha Massed, PA-C [facility] [phone]   Addendum  I agree with the PA's discharge summary.  This patient underwent a R CEA that was higher than anticipated.  I had to peel his hypoglossal nerve off his R ICA as it was densely adherent.  The patient woke without any neurologic deficits with intact CN 12 function.  The patient's post-operative course was unremarkable.  He was discharged with neurological function fully intact, no hematoma, and swallow fully intact.  The patient will be seen in 2 weeks for incision check.  Leonides Sake, MD, FACS [facility] [place] Office: [phone] Pager: [phone]  09/22/2016, 8:45 AM   --- For VQI Registry use --- Instructions: Press F2 to tab through selections.  Delete question if not applicable.   Modified Rankin score at D/C (0-6): 0  IV medication needed for:  1. Hypertension: No 2. Hypotension: No  Post-op Complications: No  1. Post-op CVA or TIA: No  If yes: Event classification (right eye, left eye, right cortical, left cortical, verterobasilar, other): n/a3  If  yes: Timing of event (intra-op, <6 hrs post-op, >=6 hrs post-op, unknown): n/a  2. CN injury: No  If yes: CN n/a injuried   3. Myocardial infarction: No  If yes: Dx by (EKG or clinical, Troponin): n/a  4.  CHF: No  5.  Dysrhythmia (new): No  6. Wound infection: No  7. Reperfusion symptoms: No  8. Return to OR: No  If yes: return to OR for (bleeding, neurologic, other CEA incision, other): n/a  Discharge medications: Statin use:  Yes If No: [ ]  For Medical reasons, [ ]  Non-compliant, [ ]  Not-indicated ASA use:  Yes  If No: [ ]  For Medical reasons, [ ]  Non-compliant, [ ]  Not-indicated Beta blocker use:  Yes If No: [ ]  For Medical reasons, [ ]  Non-compliant, [ ]  Not-indicated ACE-Inhibitor use:  Yes If No: [ ]  For Medical reasons, [ ]  Non-compliant, [ ]  Not-indicated ARB use:  No P2Y12 Antagonist use: Yes, [ x] Plavix, [ ]  Plasugrel, [ ]  Ticlopinine, [ ]  Ticagrelor, [ ]  Other, [ ]  No for medical reason, [ ]  Non-compliant, [ ]  Not-indicated Anti-coagulant use:  No, [ ]  Warfarin, [ ]  Rivaroxaban, [ ]  Dabigatran, [ ]  Other, [ ]  No for medical reason, [ ]  Non-compliant, [ ]  Not-indicated

## 2016-09-20 NOTE — Progress Notes (Signed)
Received order to d/c patient.  PIVs removed.  Pt verbalized unable to have transport home today.  Discussed with social work.  Provided pt with education about not driving for the next 2 weeks.

## 2016-09-20 NOTE — Progress Notes (Addendum)
  Progress Note    09/20/2016 7:22 AM 1 Day Post-Op  Subjective:  No complaints  afebrile HR  60's NSR 120's-150's systolic 97% 2LO2NC  Vitals:   09/19/16 2314 09/20/16 0308  BP: (!) 159/54 (!) 139/56  Pulse: 61 60  Resp: 15 17  Temp: 98.8 F (37.1 C) 98.5 F (36.9 C)     Physical Exam: Neuro:  In tact; tongue is midline Lungs:  Non labored Incision:  Clean and dry  CBC    Component Value Date/Time   WBC 7.2 09/20/2016 0500   RBC 3.89 (L) 09/20/2016 0500   HGB 13.3 09/20/2016 0500   HCT 40.9 09/20/2016 0500   PLT 158 09/20/2016 0500   MCV 105.1 (H) 09/20/2016 0500   MCH 34.2 (H) 09/20/2016 0500   MCHC 32.5 09/20/2016 0500   RDW 14.0 09/20/2016 0500   LYMPHSABS 1.2 11/03/2015 0029   MONOABS 1.1 (H) 11/03/2015 0029   EOSABS 0.2 11/03/2015 0029   BASOSABS 0.0 11/03/2015 0029    BMET    Component Value Date/Time   NA 134 (L) 09/20/2016 0500   K 4.3 09/20/2016 0500   CL 99 (L) 09/20/2016 0500   CO2 27 09/20/2016 0500   GLUCOSE 271 (H) 09/20/2016 0500   BUN 19 09/20/2016 0500   CREATININE 1.23 09/20/2016 0500   CALCIUM 8.6 (L) 09/20/2016 0500   GFRNONAA 57 (L) 09/20/2016 0500   GFRAA >60 09/20/2016 0500     Intake/Output Summary (Last 24 hours) at 09/20/16 0722 Last data filed at 09/20/16 [id number]  Gross per 24 hour  Intake             2000 ml  Output             2020 ml  Net              -20 ml     Assessment/Plan:  This is a 70 y.o. male who is s/p right CEA 1 Day Post-Op  -pt is doing well this am. -pt neuro exam is intact -no chest pain-evaluated by cardiology and pacemaker interrogated-did not reveal arrhythmia.  Troponins are 0.06, which is lower than yesterday (0.8). -pt has not ambulated-pt has walked in his room-needs to walk in the halls this morning. -swallowing without difficulty -BG up-reordered home dose of insulin for this morning -pt has voided -wean O2 this morning -f/u with Dr. Imogene Burnhen in 2 weeks.   Doreatha MassedSamantha Rhyne,  PA-C [facility] [phone]  Addendum  I have independently interviewed and examined the patient, and I agree with the physician assistant's findings.  HD stable.  No cardiac sx despite EKG chg intraop and hypotension.   Neuro intact.  No evidence of CN 12 palsy despite having to dissect the nerve off the ICA.  Swallowing ok.  - Ok to d/c once weaned off oxygen - Follow up in 2 weeks for incision check  Leonides SakeBrian Elia Keenum, MD, FACS [facility] [place] Office: [phone] Pager: [phone]  09/20/2016, 9:31 AM

## 2016-09-28 ENCOUNTER — Encounter: Payer: Self-pay | Admitting: Vascular Surgery

## 2016-09-29 NOTE — Progress Notes (Signed)
Postoperative Visit   History of Present Illness  Eric Mcguire is a 70 y.o. male who presents for postoperative follow-up for: R CEA (Date: 09/19/16) for asx R ICA >80%.  The patient's neck incision is healeding.  The patient has had no stroke or TIA symptoms.  No problems with swallow per the patient.  For VQI Use Only  PRE-ADM LIVING: Home  AMB STATUS: Ambulatory   Current Outpatient Prescriptions on File Prior to Visit  Medication Sig Dispense Refill  . acetaminophen (TYLENOL 8 HOUR) 650 MG CR tablet Take 1 tablet (650 mg total) by mouth every 8 (eight) hours as needed for pain. 30 tablet 0  . amiodarone (PACERONE) 200 MG tablet Take 200 mg by mouth daily.    Marland Kitchen. amLODipine (NORVASC) 5 MG tablet Take 1 tablet (5 mg total) by mouth daily. 30 tablet 0  . aspirin EC 81 MG tablet Take 81 mg by mouth daily.    Marland Kitchen. atorvastatin (LIPITOR) 80 MG tablet Take 80 mg by mouth daily.     . carvedilol (COREG) 3.125 MG tablet Take 3.125 mg by mouth 2 (two) times daily with a meal.    . cholecalciferol (VITAMIN D) 1000 UNITS tablet Take 1,000 Units by mouth daily.    . clopidogrel (PLAVIX) 75 MG tablet Take 75 mg by mouth daily.     . ferrous sulfate 325 (65 FE) MG tablet Take 1 tablet (325 mg total) by mouth 3 (three) times daily with meals. 90 tablet 0  . furosemide (LASIX) 40 MG tablet Take 40 mg by mouth 3 (three) times daily.    . insulin NPH-regular Human (NOVOLIN 70/30) (70-30) 100 UNIT/ML injection Inject 50-55 Units into the skin 2 (two) times daily. 55 units in the am and 50 units at night.    Marland Kitchen. lisinopril (PRINIVIL,ZESTRIL) 5 MG tablet Take 1 tablet (5 mg total) by mouth daily. 30 tablet 0  . magnesium oxide (MAG-OX) 400 MG tablet Take 400 mg by mouth daily.    Marland Kitchen. oxyCODONE-acetaminophen (PERCOCET/ROXICET) 5-325 MG tablet Take 1 tablet by mouth every 6 (six) hours as needed for moderate pain. 6 tablet 0   No current facility-administered medications on file prior to visit.      Physical Examination  Vitals:   09/30/16 1319 09/30/16 1321  BP: (!) 152/66 (!) 154/66  Pulse:    Resp:    Temp:      R Neck: Incision is c/d/i, dermabond still intact  Neuro: CN 2-12 are intact , Motor strength is 5/5 bilaterally, sensation is grossly intact   Medical Decision Making  Eric HartDonald Fulmore is a 70 y.o. male who presents s/p R CEA.   The patient's neck incision is healing with no stroke symptoms. I discussed in depth with the patient the nature of atherosclerosis, and emphasized the importance of maximal medical management including strict control of blood pressure, blood glucose, and lipid levels, obtaining regular exercise, anti-platelet use and cessation of smoking.   The patient is currently on an antiplatelet: ASA. The patient is currently on a statin: Lipitor. The patient is aware that without maximal medical management the underlying atherosclerotic disease process will progress, limiting the benefit of any interventions. The patient's surveillance will included routine carotid duplex studies which will be completed in: 9 months, at which time the patient will be re-evaluated.   I emphasized the importance of routine surveillance of the carotid arteries as recurrence of stenosis is possible, especially with proper management of underlying atherosclerotic disease.  The patient agrees to participate in their maximal medical care and routine surveillance.  Thank you for allowing us to participate in this patient's care.  Leonides SakeBrian Shawnee Gambone, MD, FACS [facility] [place] Office: [phone] Pager: [phone]

## 2016-09-30 ENCOUNTER — Encounter: Payer: Self-pay | Admitting: Vascular Surgery

## 2016-09-30 ENCOUNTER — Ambulatory Visit (INDEPENDENT_AMBULATORY_CARE_PROVIDER_SITE_OTHER): Payer: Medicare Other | Admitting: Vascular Surgery

## 2016-09-30 VITALS — BP 154/66 | HR 60 | Temp 99.0°F | Resp 16 | Ht 73.0 in | Wt 244.0 lb

## 2016-09-30 DIAGNOSIS — I739 Peripheral vascular disease, unspecified: Principal | ICD-10-CM

## 2016-09-30 DIAGNOSIS — I779 Disorder of arteries and arterioles, unspecified: Secondary | ICD-10-CM

## 2017-07-07 NOTE — Addendum Note (Signed)
Addended by: Burton ApleyPETTY, Sophiamarie Nease A on: 07/07/2017 04:11 PM   Modules accepted: Orders

## 2017-07-11 ENCOUNTER — Encounter: Payer: Self-pay | Admitting: Vascular Surgery

## 2017-07-18 NOTE — Progress Notes (Deleted)
Established Carotid Patient   History of Present Illness   Eric Mcguire is a 71 y.o. (Feb 13, 1946) male who presents with chief complaint: ***.  Pt is s/p R CEA (09/19/16) for asx R ICA > 80%.    Previous carotid studies demonstrated: RICA >80% stenosis, L CEA patent.  Patient has never history of TIA or stroke symptom.  The patient has never had amaurosis fugax or monocular blindness.  The patient has never had facial drooping or hemiplegia.  The patient has never had receptive or expressive aphasia.  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  The patient's PMH, PSH, SH, and FamHx are unchanged from 09/30/16.  Current Outpatient Prescriptions  Medication Sig Dispense Refill  . acetaminophen (TYLENOL 8 HOUR) 650 MG CR tablet Take 1 tablet (650 mg total) by mouth every 8 (eight) hours as needed for pain. 30 tablet 0  . amiodarone (PACERONE) 200 MG tablet Take 200 mg by mouth daily.    Marland Kitchen amLODipine (NORVASC) 5 MG tablet Take 1 tablet (5 mg total) by mouth daily. 30 tablet 0  . aspirin EC 81 MG tablet Take 81 mg by mouth daily.    Marland Kitchen atorvastatin (LIPITOR) 80 MG tablet Take 80 mg by mouth daily.     . carvedilol (COREG) 3.125 MG tablet Take 3.125 mg by mouth 2 (two) times daily with a meal.    . cholecalciferol (VITAMIN D) 1000 UNITS tablet Take 1,000 Units by mouth daily.    . clopidogrel (PLAVIX) 75 MG tablet Take 75 mg by mouth daily.     . ferrous sulfate 325 (65 FE) MG tablet Take 1 tablet (325 mg total) by mouth 3 (three) times daily with meals. 90 tablet 0  . furosemide (LASIX) 40 MG tablet Take 40 mg by mouth 3 (three) times daily.    . insulin NPH-regular Human (NOVOLIN 70/30) (70-30) 100 UNIT/ML injection Inject 50-55 Units into the skin 2 (two) times daily. 55 units in the am and 50 units at night.    Marland Kitchen lisinopril (PRINIVIL,ZESTRIL) 5 MG tablet Take 1 tablet (5 mg total) by mouth daily. 30 tablet 0  . magnesium oxide (MAG-OX) 400 MG tablet Take 400  mg by mouth daily.    Marland Kitchen oxyCODONE-acetaminophen (PERCOCET/ROXICET) 5-325 MG tablet Take 1 tablet by mouth every 6 (six) hours as needed for moderate pain. (Patient not taking: Reported on 09/30/2016) 6 tablet 0   No current facility-administered medications for this visit.     On ROS today: ***, ***   Physical Examination  ***There were no vitals filed for this visit. ***There is no height or weight on file to calculate BMI.  General {LOC:19197::"Somulent","Alert"}, {Orientation:19197::"Confused","O x 3"}, {Weight:19197::"Obese","Cachectic","WD"}, {General state of health:19197::"Ill appearing","Elderly","NAD"}  Neck Supple, mid-line trachea, {NeckIncision:19197::"Neck incision healed"," "}  Pulmonary {Chest wall:19197::"Asx chest movement","Sym exp"}, {Air movt:19197::"Decreased *** air movt","good B air movt"}, {BS:19197::"rales on ***","rhonchi on ***","wheezing on ***","CTA B"}  Cardiac {Rhythm:19197::"Irregularly, irregular rate and rhythm","RRR, Nl S1, S2"}, {Murmur:19197::"Murmur present: ***","no Murmurs"}, {Rubs:19197::"Rub present: ***","No rubs"}, {Gallop:19197::"Gallop present: ***","No S3,S4"}  Vascular Vessel Right Left  Radial {Palpable:19197::"Not palpable","Faintly palpable","Palpable"} {Palpable:19197::"Not palpable","Faintly palpable","Palpable"}  Brachial {Palpable:19197::"Not palpable","Faintly palpable","Palpable"} {Palpable:19197::"Not palpable","Faintly palpable","Palpable"}  Carotid Palpable, {Bruit:19197::"Bruit present","No Bruit"} Palpable, {Bruit:19197::"Bruit present","No Bruit"}  Aorta Not palpable N/A  Femoral {Palpable:19197::"Not palpable","Faintly palpable","Palpable"} {Palpable:19197::"Not palpable","Faintly palpable","Palpable"}  Popliteal {Palpable:19197::"Prominently palpable","Not palpable"} {Palpable:19197::"Prominently palpable","Not palpable"}  PT {Palpable:19197::"Not palpable","Faintly palpable","Palpable"} {Palpable:19197::"Not palpable","Faintly  palpable","Palpable"}  DP {Palpable:19197::"Not palpable","Faintly palpable","Palpable"} {Palpable:19197::"Not palpable","Faintly palpable","Palpable"}    Gastro- intestinal soft, {  Distension:19197::"distended","non-distended"}, {TTP:19197::"TTP in *** quadrant","appropriate tenderness to palpation","non-tender to palpation"}, {Guarding:19197::"Guarding and rebound in *** quadrant","No guarding or rebound"}, {HSM:19197::"HSM present","no HSM"}, {Masses:19197::"Mass present: ***","no masses"}, {Flank:19197::"CVAT on ***","Flank bruit present on ***","no CVAT B"}, {AAA:19197::"Palpable prominent aortic pulse","No palpable prominent aortic pulse"}, {Surgical incision:19197::"Surg incisions: ***","Surgical incisions well healed"," "}  Musculo- skeletal M/S 5/5 throughout {MS:19197::"except ***"," "}, Extremities without ischemic changes {MS:19197::"except ***"," "}  Neurologic Cranial nerves 2-12 intact{CN:19197::" except ***"," "}, Pain and light touch intact in extremities{CN:19197::" except for decreased sensation in ***"," "}, Motor exam as listed above    Non-Invasive Vascular Imaging   B Carotid Duplex (***):   R ICA stenosis:  {Stenosis:19197::"Occluded","80-99%","60-79%","40-59%","1-39%"}  R VA: *** patent and antegrade  L ICA stenosis:  {Stenosis:19197::"Occluded","80-99%","60-79%","40-59%","1-39%"}  L VA: *** patent and antegrade   Medical Decision Making   Eric Mcguire is a 71 y.o. male who presents with: ***sx ICA stenosis ***%.   Based on the patient's vascular studies and examination, I have offered the patient: ***.  I discussed in depth with the patient the nature of atherosclerosis, and emphasized the importance of maximal medical management including strict control of blood pressure, blood glucose, and lipid levels, antiplatelet agents, obtaining regular exercise, and cessation of smoking.    The patient is aware that without maximal medical management the underlying  atherosclerotic disease process will progress, limiting the benefit of any interventions. The patient is currently on a statin: Lipitor.  The patient is currently on an anti-platelet: ASA.  Thank you for allowing [place] to participate in this patient's care.   Leonides Sake, MD, FACS [facility] Office: [phone] Pager: [phone]  --- VASCULAR QUALITY INITIATIVE FOLLOW UP DATA:  Current smoker: [  ] yes  [x  ] no  Living status: [x  ]  Home  [  ] Nursing home  [  ] Homeless    MEDS:  ASA [ x ] yes  [  ] no- [  ] medical reason  [  ] non compliant  STATIN  [  x] yes  [  ] no- [  ] medical reason  [  ] non compliant  Beta blocker [ x ] yes  [  ] no- [  ] medical reason  [  ] non compliant  ACE inhibitor [ x ] yes  [  ] no- [  ] medical reason  [  ] non compliant  P2Y12 Antagonist [  x] none  [  ] clopidogrel-Plavix  [  ] ticlopidine-Ticlid   [  ] prasugrel-Effient  [  ] ticagrelor- Brilinta    Anticoagulant [ x ] None  [  ] warfarin  [  ] rivaroxaban-Xarelto [  ] dabigatran- Pradaxa  Neurologic event since D/C:  [ x ] no  [  ] yes: [  ] eye event  [  ] cortical event  [  ] VB event  [  ] non specific event  [  ] right  [  ] left  [  ] TIA  [  ] stroke  Date:   Modified Rankin Score: 0  MI since D/C: [x  ] no  [  ] troponin only  [  ] EKG or clinical  Cranial nerve injury: [x  ] none  [  ] resolved  [  ] persistent  Duplex CEA site: [  ] no  [ x ] yes - PSV= ***  EDV= ***  ICA/CCA ratio: ***  Stenosis= [  ] <  40% [  ] 40-59% [  ] 60-79%  [  ] > 80%  [  ]  Occluded  CEA site re-operation:  [ x ] no   [  ] yes- date of re-op:  CEA site PCI:   [x  ] no   [  ] yes- date of PCI:

## 2017-07-21 ENCOUNTER — Encounter (HOSPITAL_COMMUNITY): Payer: Medicare Other

## 2017-07-21 ENCOUNTER — Ambulatory Visit: Payer: Non-veteran care | Admitting: Vascular Surgery

## 2017-07-25 IMAGING — CR DG CHEST 2V
2 series · 2 of 2 positions shown · non-contrast
Comparison: 05/17/2015

CLINICAL DATA: Short of breath.  Anasarca.  Cough.

EXAM:
CHEST  2 VIEW

[w chest pa]
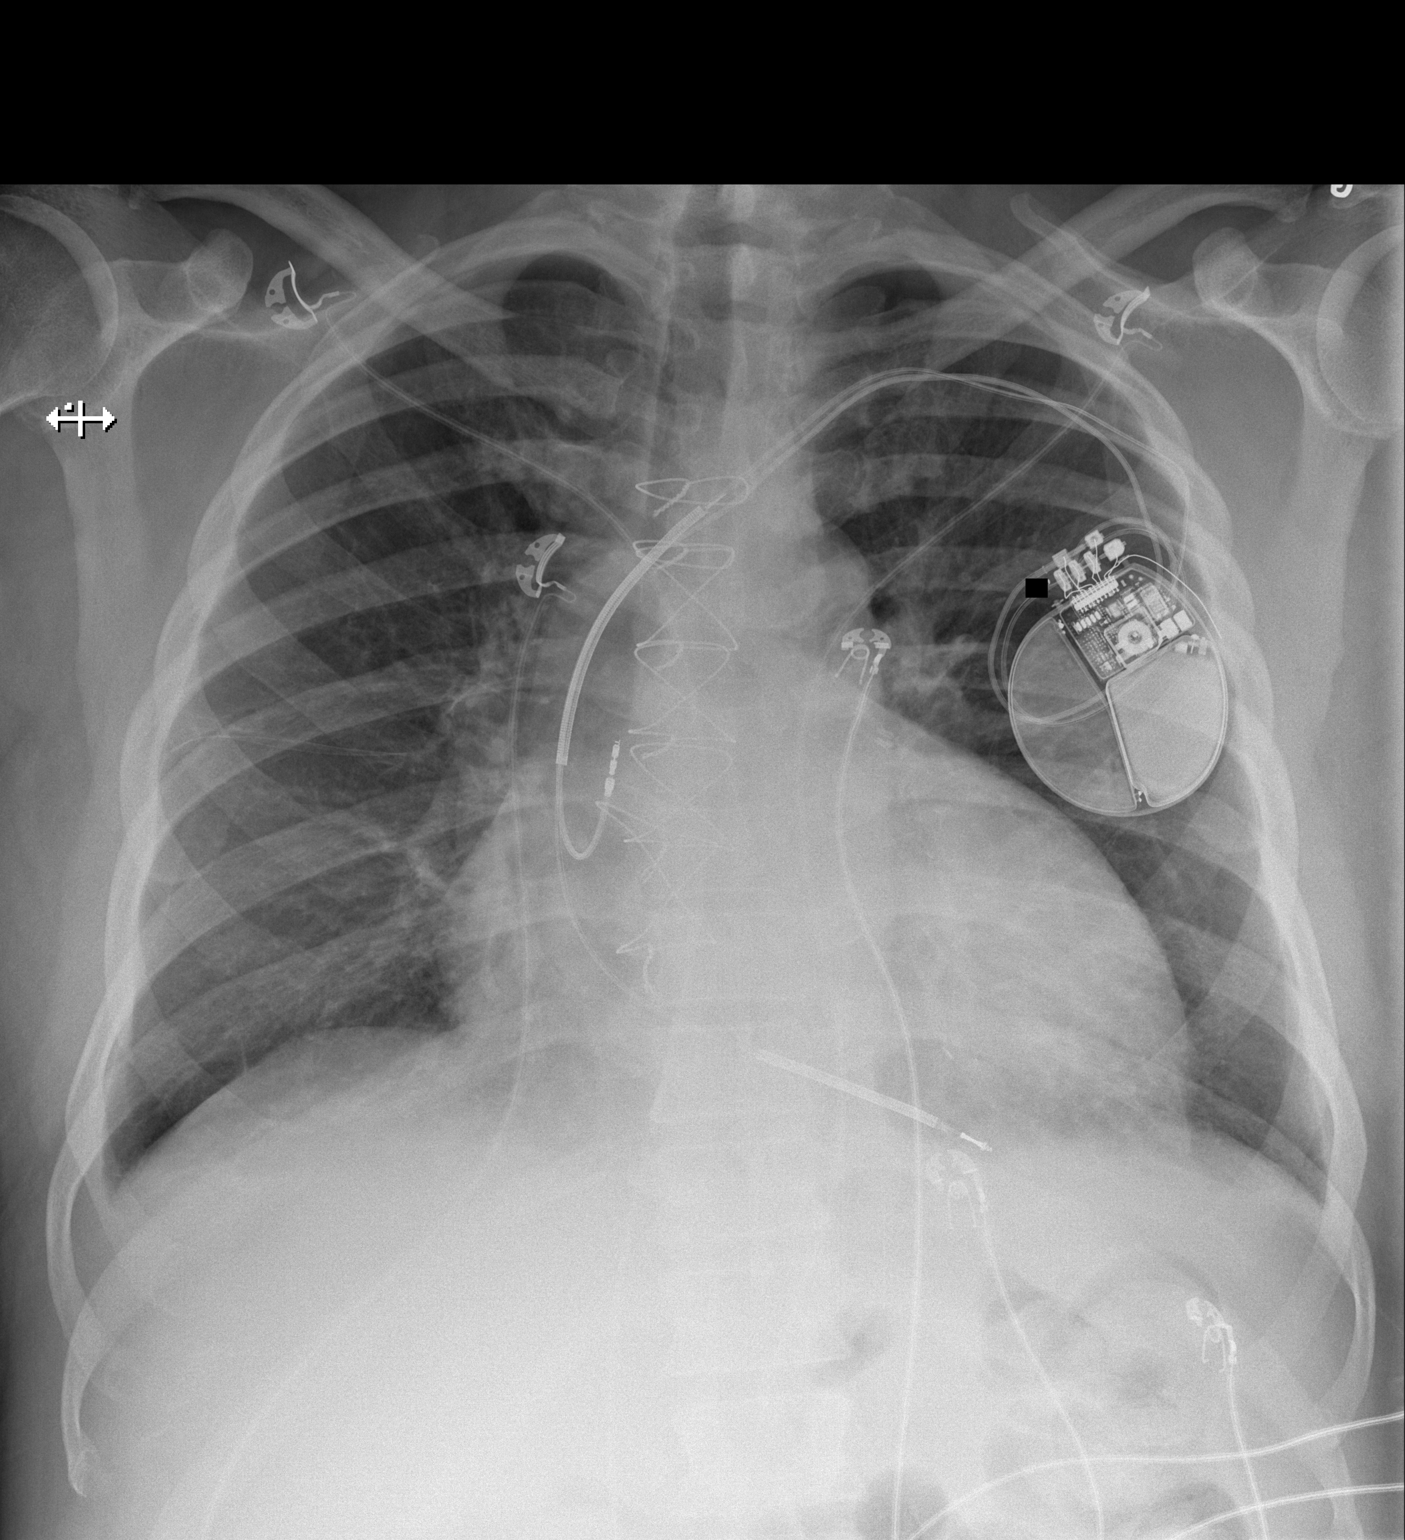

[w chest lat]
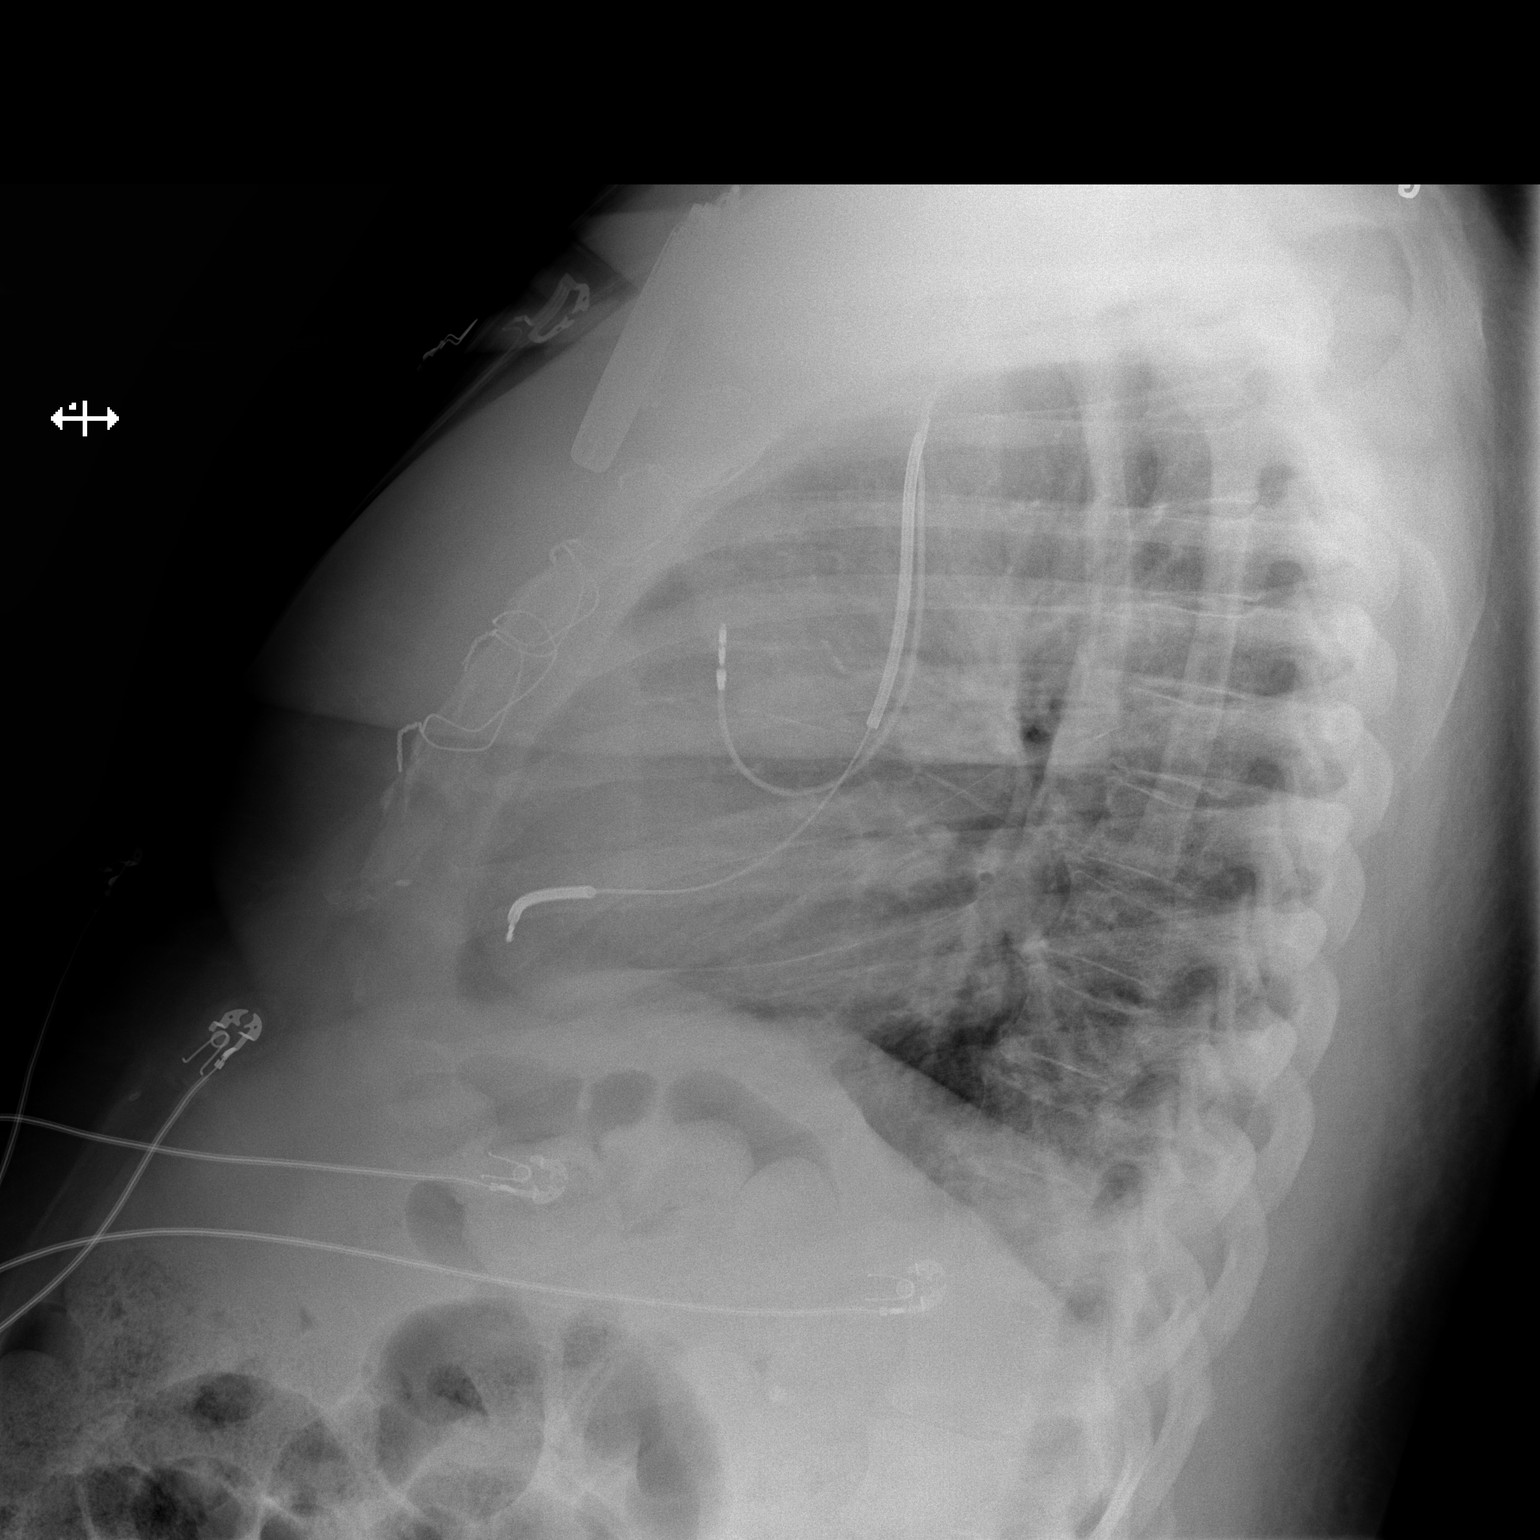

[2 of 2 positions shown; findings below may reference images not displayed]

FINDINGS: Cardiac enlargement with vascular congestion which has progressed.
Mild interstitial edema and small pleural effusions bilaterally.

AICD remains in good position.
IMPRESSION: Congestive heart failure with mild edema and small pleural
effusions.

## 2019-01-27 DEATH — deceased
